# Patient Record
Sex: Male | Born: 1965 | Race: Black or African American | Hispanic: No | Smoking: Never smoker
Health system: Southern US, Community
[De-identification: ages and names within clinical notes are randomized; demographics above are authoritative.]

## PROBLEM LIST (undated history)

## (undated) DIAGNOSIS — Z9889 Other specified postprocedural states: Secondary | ICD-10-CM

## (undated) DIAGNOSIS — R7303 Prediabetes: Secondary | ICD-10-CM

## (undated) DIAGNOSIS — M069 Rheumatoid arthritis, unspecified: Secondary | ICD-10-CM

## (undated) DIAGNOSIS — E538 Deficiency of other specified B group vitamins: Secondary | ICD-10-CM

## (undated) DIAGNOSIS — Z8659 Personal history of other mental and behavioral disorders: Secondary | ICD-10-CM

## (undated) DIAGNOSIS — Z8719 Personal history of other diseases of the digestive system: Secondary | ICD-10-CM

## (undated) DIAGNOSIS — R112 Nausea with vomiting, unspecified: Secondary | ICD-10-CM

## (undated) DIAGNOSIS — I10 Essential (primary) hypertension: Secondary | ICD-10-CM

## (undated) DIAGNOSIS — M792 Neuralgia and neuritis, unspecified: Secondary | ICD-10-CM

## (undated) HISTORY — PX: BACK SURGERY: SHX140

## (undated) HISTORY — PX: LEG SURGERY: SHX1003

---

## 2003-03-03 ENCOUNTER — Ambulatory Visit (HOSPITAL_COMMUNITY): Admission: RE | Admit: 2003-03-03 | Discharge: 2003-03-03 | Payer: Self-pay | Admitting: Orthopedic Surgery

## 2006-01-11 ENCOUNTER — Encounter: Payer: Self-pay | Admitting: Physical Medicine & Rehabilitation

## 2006-02-11 ENCOUNTER — Encounter: Payer: Self-pay | Admitting: Physical Medicine & Rehabilitation

## 2006-03-13 ENCOUNTER — Encounter: Payer: Self-pay | Admitting: Physical Medicine & Rehabilitation

## 2006-04-13 ENCOUNTER — Encounter: Payer: Self-pay | Admitting: Physical Medicine & Rehabilitation

## 2006-05-14 ENCOUNTER — Encounter: Payer: Self-pay | Admitting: Physical Medicine & Rehabilitation

## 2006-06-12 ENCOUNTER — Encounter: Payer: Self-pay | Admitting: Physical Medicine & Rehabilitation

## 2006-07-13 ENCOUNTER — Encounter: Payer: Self-pay | Admitting: Physical Medicine & Rehabilitation

## 2006-08-12 ENCOUNTER — Encounter: Payer: Self-pay | Admitting: Physical Medicine & Rehabilitation

## 2006-09-12 ENCOUNTER — Encounter: Payer: Self-pay | Admitting: Physical Medicine & Rehabilitation

## 2006-10-12 ENCOUNTER — Encounter: Payer: Self-pay | Admitting: Physical Medicine & Rehabilitation

## 2006-10-28 ENCOUNTER — Ambulatory Visit: Payer: Self-pay | Admitting: Pain Medicine

## 2006-11-08 ENCOUNTER — Ambulatory Visit: Payer: Self-pay | Admitting: Pain Medicine

## 2006-11-12 ENCOUNTER — Encounter: Payer: Self-pay | Admitting: Physical Medicine & Rehabilitation

## 2006-11-22 ENCOUNTER — Ambulatory Visit: Payer: Self-pay | Admitting: Pain Medicine

## 2006-12-07 ENCOUNTER — Ambulatory Visit: Payer: Self-pay | Admitting: Pain Medicine

## 2006-12-13 ENCOUNTER — Encounter: Payer: Self-pay | Admitting: Physical Medicine & Rehabilitation

## 2006-12-22 ENCOUNTER — Ambulatory Visit: Payer: Self-pay | Admitting: Pain Medicine

## 2007-01-05 ENCOUNTER — Ambulatory Visit: Payer: Self-pay | Admitting: Pain Medicine

## 2007-01-12 ENCOUNTER — Encounter: Payer: Self-pay | Admitting: Physical Medicine & Rehabilitation

## 2007-02-10 ENCOUNTER — Ambulatory Visit: Payer: Self-pay | Admitting: Pain Medicine

## 2007-02-16 ENCOUNTER — Ambulatory Visit: Payer: Self-pay | Admitting: Pain Medicine

## 2007-03-29 ENCOUNTER — Ambulatory Visit: Payer: Self-pay | Admitting: Pain Medicine

## 2007-03-30 ENCOUNTER — Ambulatory Visit: Payer: Self-pay | Admitting: Pain Medicine

## 2007-05-10 ENCOUNTER — Ambulatory Visit: Payer: Self-pay | Admitting: Pain Medicine

## 2007-05-16 ENCOUNTER — Ambulatory Visit: Payer: Self-pay | Admitting: Pain Medicine

## 2007-06-07 ENCOUNTER — Ambulatory Visit: Payer: Self-pay | Admitting: Pain Medicine

## 2007-06-15 ENCOUNTER — Ambulatory Visit: Payer: Self-pay | Admitting: Pain Medicine

## 2007-07-05 ENCOUNTER — Ambulatory Visit: Payer: Self-pay | Admitting: Pain Medicine

## 2007-07-11 ENCOUNTER — Ambulatory Visit: Payer: Self-pay | Admitting: Pain Medicine

## 2007-08-16 ENCOUNTER — Ambulatory Visit: Payer: Self-pay | Admitting: Pain Medicine

## 2007-08-22 ENCOUNTER — Ambulatory Visit: Payer: Self-pay | Admitting: Pain Medicine

## 2007-09-21 ENCOUNTER — Ambulatory Visit: Payer: Self-pay | Admitting: Pain Medicine

## 2007-09-28 ENCOUNTER — Ambulatory Visit: Payer: Self-pay | Admitting: Pain Medicine

## 2007-11-01 ENCOUNTER — Ambulatory Visit: Payer: Self-pay | Admitting: Pain Medicine

## 2007-11-07 ENCOUNTER — Ambulatory Visit: Payer: Self-pay | Admitting: Pain Medicine

## 2007-12-13 ENCOUNTER — Ambulatory Visit: Payer: Self-pay | Admitting: Pain Medicine

## 2007-12-21 ENCOUNTER — Ambulatory Visit: Payer: Self-pay | Admitting: Pain Medicine

## 2008-01-24 ENCOUNTER — Ambulatory Visit: Payer: Self-pay | Admitting: Pain Medicine

## 2008-01-30 ENCOUNTER — Ambulatory Visit: Payer: Self-pay | Admitting: Pain Medicine

## 2008-02-07 ENCOUNTER — Ambulatory Visit: Payer: Self-pay | Admitting: Gastroenterology

## 2008-02-08 ENCOUNTER — Ambulatory Visit: Payer: Self-pay | Admitting: Gastroenterology

## 2008-02-23 ENCOUNTER — Ambulatory Visit: Payer: Self-pay | Admitting: Pain Medicine

## 2008-02-29 ENCOUNTER — Ambulatory Visit: Payer: Self-pay | Admitting: Pain Medicine

## 2008-03-20 ENCOUNTER — Ambulatory Visit: Payer: Self-pay | Admitting: Pain Medicine

## 2008-03-26 ENCOUNTER — Ambulatory Visit: Payer: Self-pay | Admitting: Pain Medicine

## 2008-04-19 ENCOUNTER — Ambulatory Visit: Payer: Self-pay | Admitting: Pain Medicine

## 2008-04-25 ENCOUNTER — Ambulatory Visit: Payer: Self-pay | Admitting: Pain Medicine

## 2008-05-17 ENCOUNTER — Ambulatory Visit: Payer: Self-pay | Admitting: Pain Medicine

## 2008-05-28 ENCOUNTER — Ambulatory Visit: Payer: Self-pay | Admitting: Pain Medicine

## 2008-06-19 ENCOUNTER — Ambulatory Visit: Payer: Self-pay | Admitting: Pain Medicine

## 2008-06-27 ENCOUNTER — Ambulatory Visit: Payer: Self-pay | Admitting: Pain Medicine

## 2008-07-19 ENCOUNTER — Ambulatory Visit: Payer: Self-pay | Admitting: Pain Medicine

## 2008-08-16 ENCOUNTER — Ambulatory Visit: Payer: Self-pay | Admitting: Pain Medicine

## 2008-08-20 ENCOUNTER — Ambulatory Visit: Payer: Self-pay | Admitting: Pain Medicine

## 2008-09-13 ENCOUNTER — Ambulatory Visit: Payer: Self-pay | Admitting: Pain Medicine

## 2008-09-19 ENCOUNTER — Ambulatory Visit: Payer: Self-pay | Admitting: Pain Medicine

## 2008-10-11 ENCOUNTER — Ambulatory Visit: Payer: Self-pay | Admitting: Pain Medicine

## 2008-11-13 ENCOUNTER — Ambulatory Visit: Payer: Self-pay | Admitting: Pain Medicine

## 2008-11-28 ENCOUNTER — Ambulatory Visit: Payer: Self-pay | Admitting: Pain Medicine

## 2008-12-27 ENCOUNTER — Ambulatory Visit: Payer: Self-pay | Admitting: Pain Medicine

## 2009-01-09 ENCOUNTER — Ambulatory Visit: Payer: Self-pay | Admitting: Pain Medicine

## 2009-01-20 IMAGING — NM NM BONE LIMITED
2 series · 10 of 10 positions shown · non-contrast
Comparison: none

REASON FOR EXAM: Lower extremity pain, greater on left
COMMENTS:

PROCEDURE:     NM  - NM LIMITED BONE SCAN 3HR [DATE]  [DATE]
RESULT:     The patient received an injection of 21.19 mCi of Technetium 99m
MDP. Three Phase Bone Scan is performed.
There is no prior study available for comparison.

[Series 1000: immediate · 4.80mm/px · 2 acquisitions, 4 frames shown]
[im 1/2]
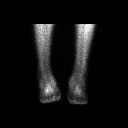
[im 1/2]
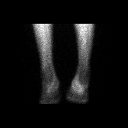
[im 2/2]
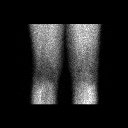
[im 2/2]
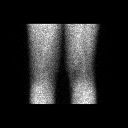

[Series 1000: flow · 4.80mm/px · 6 of 60 frames shown]
[frame 6/60  full-range]
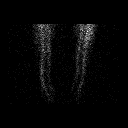
[frame 16/60  full-range]
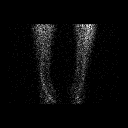
[frame 26/60  full-range]
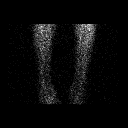
[frame 36/60]
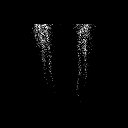
[frame 46/60  full-range]
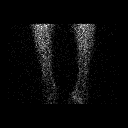
[frame 56/60  full-range]
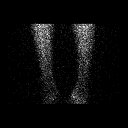

[10 of 10 positions shown; findings below may reference images not displayed]

FINDINGS: The arterial phase images demonstrate relative symmetry in both
lower extremities. There may be slightly increased flow in the RIGHT foot.
Blood pool images show some increased localization in the RIGHT foot.
Delayed images show increased localization in multiple foci in the LEFT
femur. This is somewhat subtle. Correlation with plain films is recommended.
The possibility of a pathologic lesion or impending fracture could not be
excluded. There is also increased localization in the LEFT tibia and fibula
as well as in the ankle.
IMPRESSION: Areas of abnormal uptake in the LEFT leg from the femur
into the tibia and fibula and ankle region. Whether this represents the
sequela of previous fracture or occult fracture is uncertain. Metastatic
disease cannot be completely excluded but given the patient's age and
underlying history is felt to be less likely. There are some degenerative
changes in the LEFT foot as well. There are no plain films available for
comparison.

## 2009-02-14 ENCOUNTER — Ambulatory Visit: Payer: Self-pay | Admitting: Pain Medicine

## 2009-03-18 ENCOUNTER — Ambulatory Visit: Payer: Self-pay | Admitting: Pain Medicine

## 2009-04-16 ENCOUNTER — Ambulatory Visit: Payer: Self-pay | Admitting: Pain Medicine

## 2009-05-16 ENCOUNTER — Ambulatory Visit: Payer: Self-pay | Admitting: Pain Medicine

## 2009-05-17 ENCOUNTER — Emergency Department: Payer: Self-pay | Admitting: Emergency Medicine

## 2009-06-03 ENCOUNTER — Ambulatory Visit: Payer: Self-pay | Admitting: Pain Medicine

## 2009-06-27 ENCOUNTER — Ambulatory Visit: Payer: Self-pay | Admitting: Pain Medicine

## 2009-07-24 ENCOUNTER — Ambulatory Visit: Payer: Self-pay | Admitting: Pain Medicine

## 2009-08-22 ENCOUNTER — Ambulatory Visit: Payer: Self-pay | Admitting: Pain Medicine

## 2009-08-28 ENCOUNTER — Ambulatory Visit: Payer: Self-pay | Admitting: Pain Medicine

## 2009-09-23 ENCOUNTER — Ambulatory Visit: Payer: Self-pay | Admitting: Pain Medicine

## 2009-10-21 ENCOUNTER — Ambulatory Visit: Payer: Self-pay | Admitting: Pain Medicine

## 2009-11-21 ENCOUNTER — Ambulatory Visit: Payer: Self-pay | Admitting: Pain Medicine

## 2009-11-27 ENCOUNTER — Ambulatory Visit: Payer: Self-pay | Admitting: Pain Medicine

## 2010-01-02 ENCOUNTER — Ambulatory Visit: Payer: Self-pay | Admitting: Pain Medicine

## 2010-01-22 ENCOUNTER — Ambulatory Visit: Payer: Self-pay | Admitting: Pain Medicine

## 2010-02-20 ENCOUNTER — Ambulatory Visit: Payer: Self-pay | Admitting: Pain Medicine

## 2010-02-26 ENCOUNTER — Ambulatory Visit: Payer: Self-pay | Admitting: Pain Medicine

## 2010-04-03 ENCOUNTER — Ambulatory Visit: Payer: Self-pay | Admitting: Pain Medicine

## 2010-04-30 ENCOUNTER — Ambulatory Visit: Payer: Self-pay | Admitting: Pain Medicine

## 2010-06-03 ENCOUNTER — Ambulatory Visit: Payer: Self-pay | Admitting: Pain Medicine

## 2010-07-14 ENCOUNTER — Ambulatory Visit: Payer: Self-pay | Admitting: Pain Medicine

## 2010-08-19 ENCOUNTER — Ambulatory Visit: Payer: Self-pay | Admitting: Pain Medicine

## 2010-08-27 ENCOUNTER — Ambulatory Visit: Payer: Self-pay | Admitting: Pain Medicine

## 2010-10-02 ENCOUNTER — Ambulatory Visit: Payer: Self-pay | Admitting: Pain Medicine

## 2010-10-08 ENCOUNTER — Ambulatory Visit: Payer: Self-pay | Admitting: Pain Medicine

## 2010-11-04 ENCOUNTER — Ambulatory Visit: Payer: Self-pay | Admitting: Pain Medicine

## 2010-11-12 ENCOUNTER — Ambulatory Visit: Payer: Self-pay | Admitting: Pain Medicine

## 2010-11-19 ENCOUNTER — Ambulatory Visit: Payer: Self-pay | Admitting: Pain Medicine

## 2010-11-26 ENCOUNTER — Ambulatory Visit: Payer: Self-pay | Admitting: Pain Medicine

## 2010-12-03 ENCOUNTER — Ambulatory Visit: Payer: Self-pay | Admitting: Pain Medicine

## 2010-12-30 ENCOUNTER — Ambulatory Visit: Payer: Self-pay | Admitting: Pain Medicine

## 2011-01-29 ENCOUNTER — Ambulatory Visit: Payer: Self-pay | Admitting: Pain Medicine

## 2011-02-18 ENCOUNTER — Ambulatory Visit: Payer: Self-pay | Admitting: Pain Medicine

## 2011-03-18 ENCOUNTER — Ambulatory Visit: Payer: Self-pay | Admitting: Pain Medicine

## 2011-04-15 ENCOUNTER — Ambulatory Visit: Payer: Self-pay | Admitting: Pain Medicine

## 2011-05-07 ENCOUNTER — Ambulatory Visit: Payer: Self-pay | Admitting: Pain Medicine

## 2011-05-13 ENCOUNTER — Ambulatory Visit: Payer: Self-pay | Admitting: Pain Medicine

## 2011-06-16 IMAGING — CR DG CHEST 2V
1 series · 4 of 4 positions shown · non-contrast
Comparison: none

REASON FOR EXAM: SOB
COMMENTS:

PROCEDURE:     DXR - DXR CHEST PA (OR AP) AND LATERAL  - May 17, 2009  [DATE]
RESULT:     Mild infiltrate in the right lower lobe and right upper lobe
cannot be excluded and follow-up chest x-ray is suggested. The
cardiovascular structures are unremarkable.

[Series 1: view not recorded · 0.17mm/px · 4 of 4 slices shown]
[im 1/4]
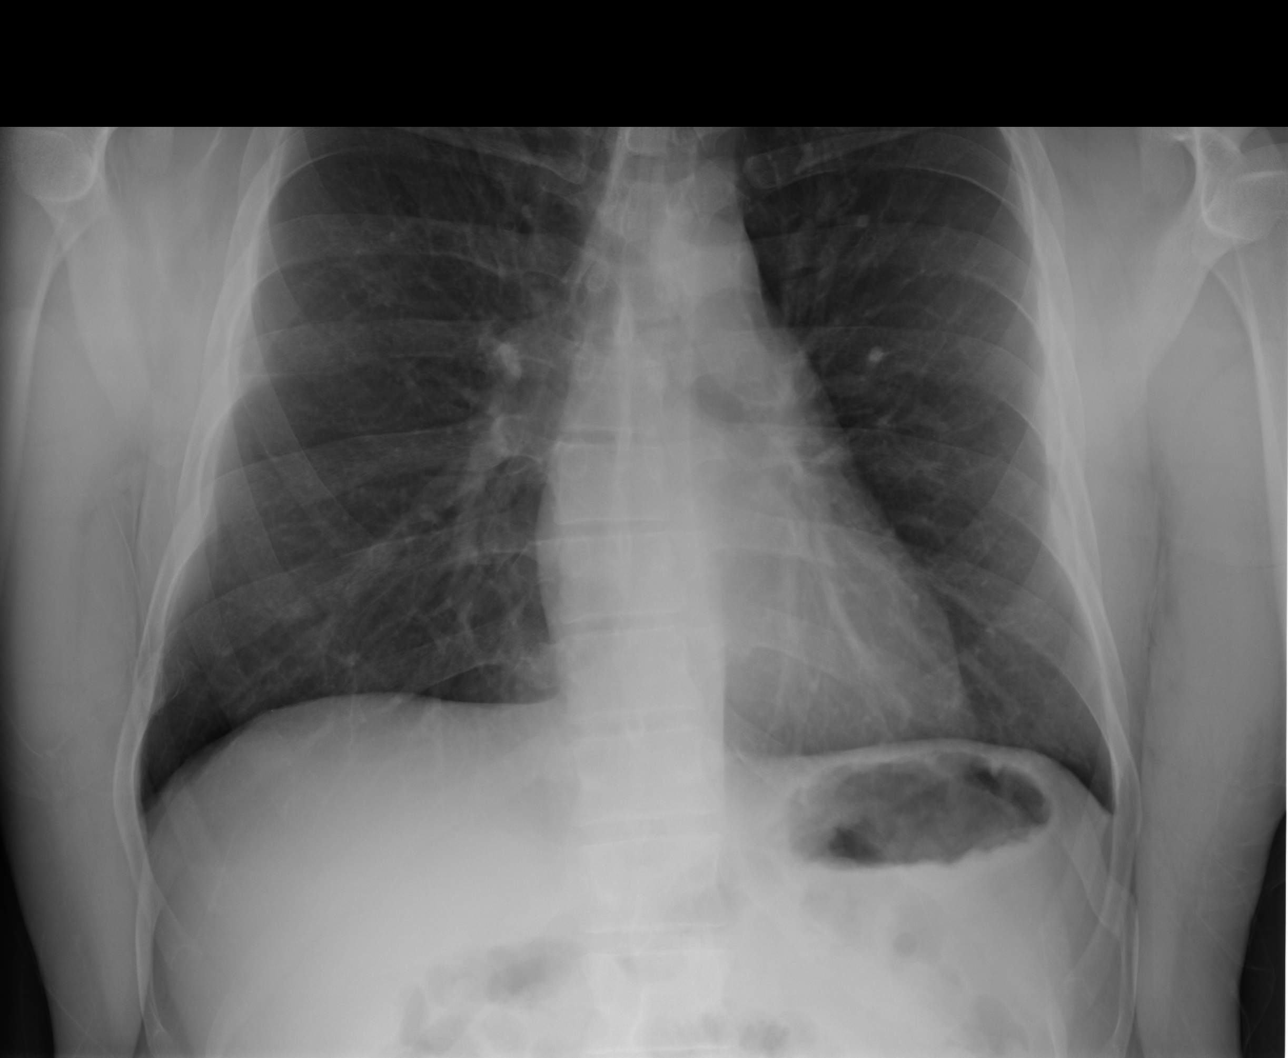
[im 2/4]
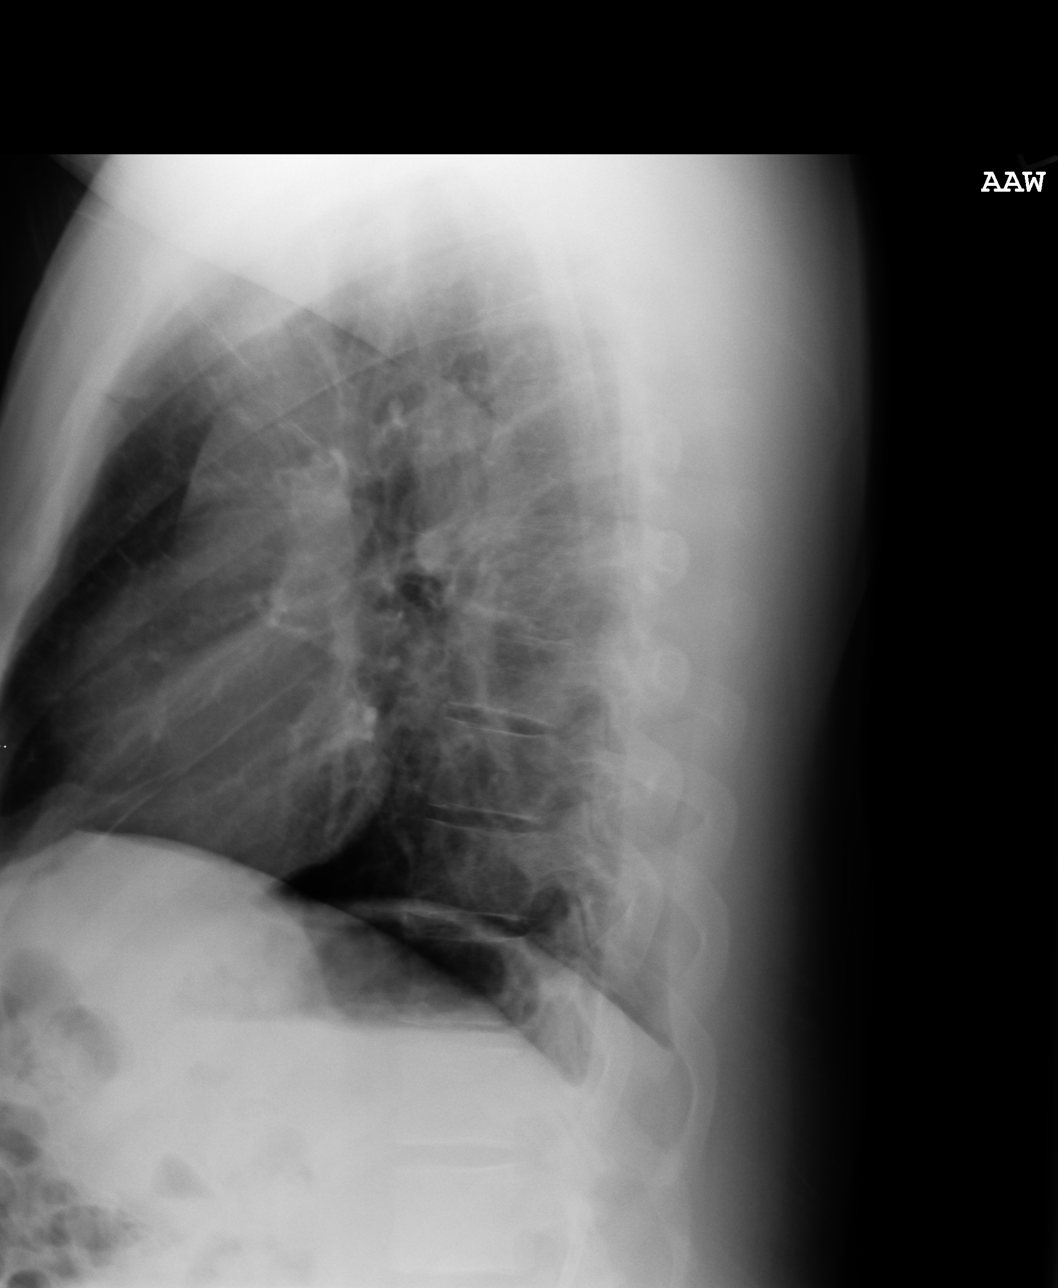
[im 3/4]
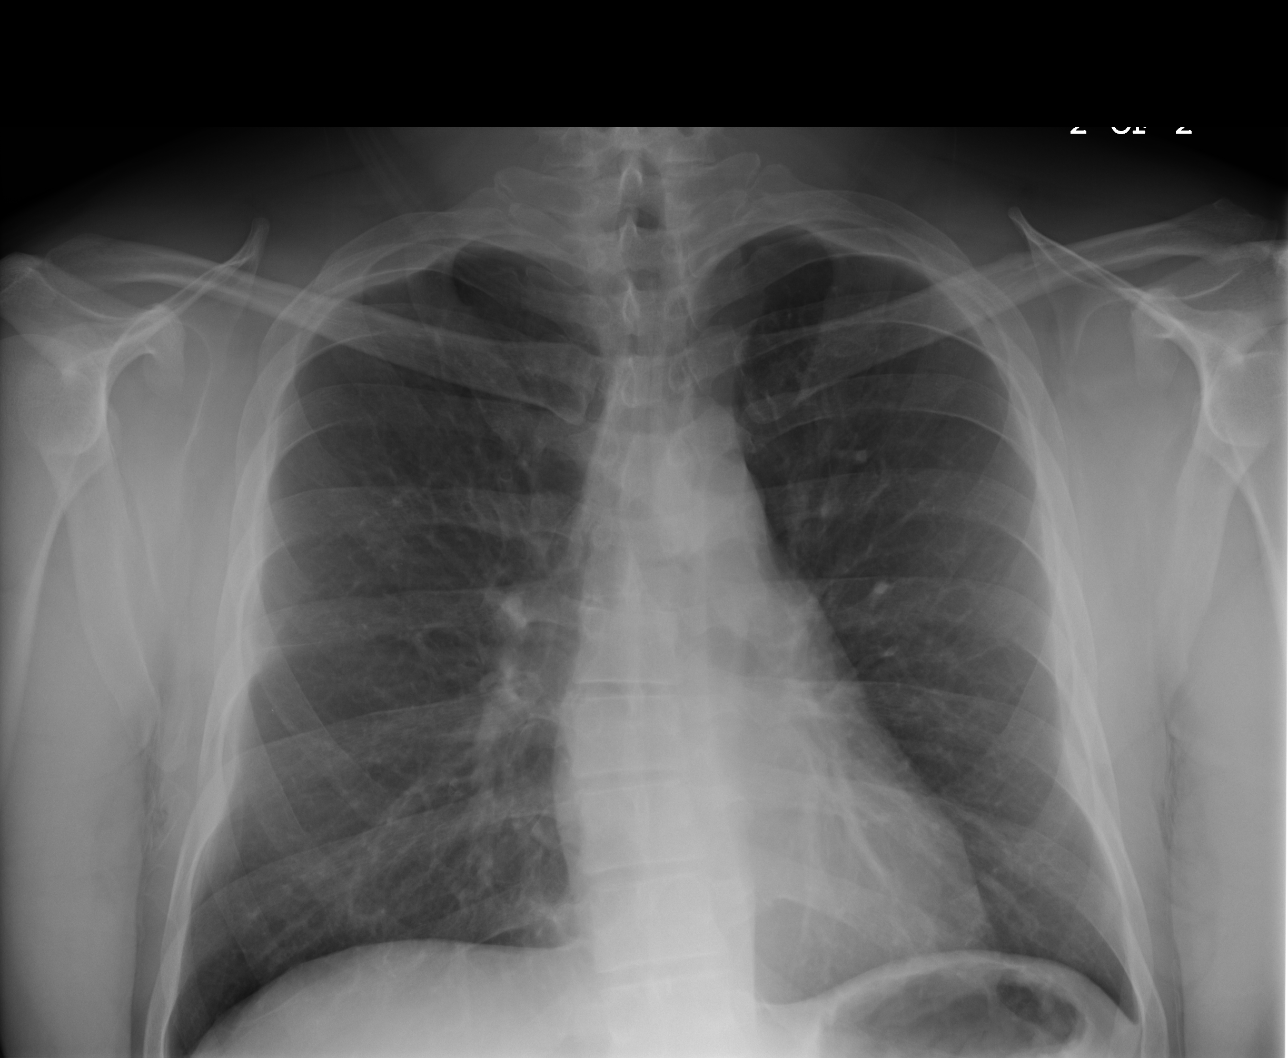
[im 4/4]
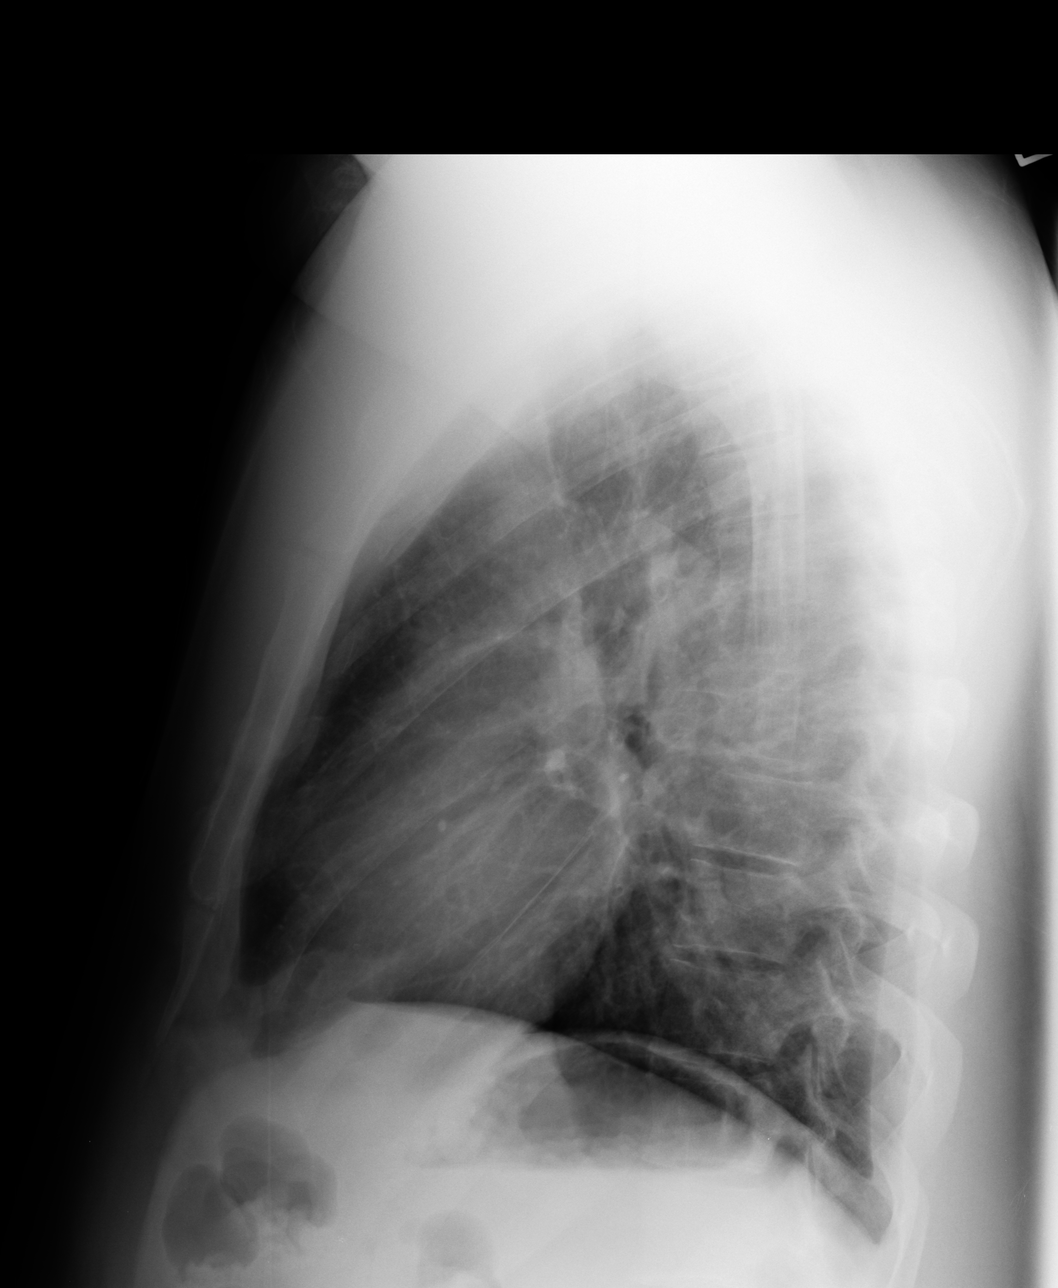

[4 of 4 positions shown; findings below may reference images not displayed]

IMPRESSION: Cannot exclude very mild infiltrate in the right lung base
and right upper lobe. Follow-up chest x-ray is suggested. Thoracic spine
scoliosis is incidentally noted.

## 2011-07-07 ENCOUNTER — Ambulatory Visit: Payer: Self-pay | Admitting: Pain Medicine

## 2011-08-06 ENCOUNTER — Ambulatory Visit: Payer: Self-pay | Admitting: Pain Medicine

## 2011-08-17 ENCOUNTER — Ambulatory Visit: Payer: Self-pay | Admitting: Pain Medicine

## 2011-09-17 ENCOUNTER — Ambulatory Visit: Payer: Self-pay | Admitting: Pain Medicine

## 2011-09-23 ENCOUNTER — Ambulatory Visit: Payer: Self-pay | Admitting: Pain Medicine

## 2011-10-29 ENCOUNTER — Ambulatory Visit: Payer: Self-pay | Admitting: Pain Medicine

## 2011-11-09 ENCOUNTER — Ambulatory Visit: Payer: Self-pay | Admitting: Pain Medicine

## 2011-12-01 ENCOUNTER — Ambulatory Visit: Payer: Self-pay | Admitting: Pain Medicine

## 2011-12-29 ENCOUNTER — Ambulatory Visit: Payer: Self-pay | Admitting: Pain Medicine

## 2012-01-13 ENCOUNTER — Ambulatory Visit: Payer: Self-pay | Admitting: Pain Medicine

## 2012-02-09 ENCOUNTER — Ambulatory Visit: Payer: Self-pay | Admitting: Pain Medicine

## 2012-02-22 ENCOUNTER — Ambulatory Visit: Payer: Self-pay | Admitting: Pain Medicine

## 2012-03-22 ENCOUNTER — Ambulatory Visit: Payer: Self-pay | Admitting: Pain Medicine

## 2012-05-16 ENCOUNTER — Ambulatory Visit: Payer: Self-pay | Admitting: Pain Medicine

## 2012-06-07 ENCOUNTER — Ambulatory Visit: Payer: Self-pay | Admitting: Pain Medicine

## 2012-06-15 ENCOUNTER — Ambulatory Visit: Payer: Self-pay | Admitting: Pain Medicine

## 2012-07-07 ENCOUNTER — Ambulatory Visit: Payer: Self-pay | Admitting: Pain Medicine

## 2012-07-25 ENCOUNTER — Ambulatory Visit: Payer: Self-pay | Admitting: Pain Medicine

## 2012-08-23 ENCOUNTER — Ambulatory Visit: Payer: Self-pay | Admitting: Pain Medicine

## 2012-08-29 ENCOUNTER — Ambulatory Visit: Payer: Self-pay | Admitting: Pain Medicine

## 2012-09-27 ENCOUNTER — Ambulatory Visit: Payer: Self-pay | Admitting: Pain Medicine

## 2012-10-10 ENCOUNTER — Ambulatory Visit: Payer: Self-pay | Admitting: Pain Medicine

## 2012-11-08 ENCOUNTER — Ambulatory Visit: Payer: Self-pay | Admitting: Pain Medicine

## 2012-12-05 ENCOUNTER — Ambulatory Visit: Payer: Self-pay | Admitting: Pain Medicine

## 2013-01-03 ENCOUNTER — Ambulatory Visit: Payer: Self-pay | Admitting: Pain Medicine

## 2013-01-09 ENCOUNTER — Ambulatory Visit: Payer: Self-pay | Admitting: Pain Medicine

## 2013-02-07 ENCOUNTER — Ambulatory Visit: Payer: Self-pay | Admitting: Pain Medicine

## 2013-03-07 ENCOUNTER — Ambulatory Visit: Payer: Self-pay | Admitting: Pain Medicine

## 2013-03-27 ENCOUNTER — Ambulatory Visit: Payer: Self-pay | Admitting: Pain Medicine

## 2013-04-11 ENCOUNTER — Ambulatory Visit: Payer: Self-pay | Admitting: Pain Medicine

## 2013-04-13 HISTORY — PX: OTHER SURGICAL HISTORY: SHX169

## 2013-05-11 ENCOUNTER — Ambulatory Visit: Payer: Self-pay | Admitting: Pain Medicine

## 2013-05-22 ENCOUNTER — Ambulatory Visit: Payer: Self-pay | Admitting: Pain Medicine

## 2013-06-19 ENCOUNTER — Emergency Department: Payer: Self-pay | Admitting: Emergency Medicine

## 2013-06-19 LAB — CBC WITH DIFFERENTIAL/PLATELET
Basophil #: 0.1 10*3/uL (ref 0.0–0.1)
Basophil %: 0.5 %
Eosinophil #: 0 10*3/uL (ref 0.0–0.7)
Eosinophil %: 0.2 %
HCT: 46.2 % (ref 40.0–52.0)
HGB: 15.5 g/dL (ref 13.0–18.0)
Lymphocyte #: 3.2 10*3/uL (ref 1.0–3.6)
Lymphocyte %: 28.3 %
MCH: 30.1 pg (ref 26.0–34.0)
MCHC: 33.5 g/dL (ref 32.0–36.0)
MCV: 90 fL (ref 80–100)
Monocyte #: 0.9 x10 3/mm (ref 0.2–1.0)
Monocyte %: 7.7 %
Neutrophil #: 7.2 10*3/uL — ABNORMAL HIGH (ref 1.4–6.5)
Neutrophil %: 63.3 %
Platelet: 214 10*3/uL (ref 150–440)
RBC: 5.15 10*6/uL (ref 4.40–5.90)
RDW: 13.2 % (ref 11.5–14.5)
WBC: 11.4 10*3/uL — ABNORMAL HIGH (ref 3.8–10.6)

## 2013-06-19 LAB — URINALYSIS, COMPLETE
Blood: NEGATIVE
Glucose,UR: NEGATIVE mg/dL (ref 0–75)
Ketone: NEGATIVE
Leukocyte Esterase: NEGATIVE
Nitrite: NEGATIVE
Ph: 5 (ref 4.5–8.0)
Protein: 100
RBC,UR: 2 /HPF (ref 0–5)
Specific Gravity: 1.036 (ref 1.003–1.030)
Squamous Epithelial: <1
WBC UR: 4 /HPF (ref 0–5)

## 2013-06-19 LAB — COMPREHENSIVE METABOLIC PANEL
Albumin: 3.6 g/dL (ref 3.4–5.0)
Alkaline Phosphatase: 93 U/L
Anion Gap: 8 (ref 7–16)
BUN: 8 mg/dL (ref 7–18)
Bilirubin,Total: 1.8 mg/dL — ABNORMAL HIGH (ref 0.2–1.0)
Calcium, Total: 9.6 mg/dL (ref 8.5–10.1)
Chloride: 97 mmol/L — ABNORMAL LOW (ref 98–107)
Co2: 30 mmol/L (ref 21–32)
Creatinine: 1.15 mg/dL (ref 0.60–1.30)
EGFR (African American): >60
EGFR (Non-African Amer.): >60
Glucose: 105 mg/dL — ABNORMAL HIGH (ref 65–99)
Osmolality: 269 (ref 275–301)
Potassium: 3.7 mmol/L (ref 3.5–5.1)
SGOT(AST): 35 U/L (ref 15–37)
SGPT (ALT): 41 U/L (ref 12–78)
Sodium: 135 mmol/L — ABNORMAL LOW (ref 136–145)
Total Protein: 8.4 g/dL — ABNORMAL HIGH (ref 6.4–8.2)

## 2013-06-19 LAB — LIPASE, BLOOD: Lipase: 125 U/L (ref 73–393)

## 2013-06-19 LAB — MONONUCLEOSIS SCREEN: Mono Test: NEGATIVE

## 2013-07-11 ENCOUNTER — Ambulatory Visit: Payer: Self-pay | Admitting: Pain Medicine

## 2013-07-26 ENCOUNTER — Ambulatory Visit: Payer: Self-pay | Admitting: Pain Medicine

## 2013-08-10 ENCOUNTER — Ambulatory Visit: Payer: Self-pay | Admitting: Pain Medicine

## 2013-09-05 ENCOUNTER — Ambulatory Visit: Payer: Self-pay | Admitting: Pain Medicine

## 2013-09-13 ENCOUNTER — Ambulatory Visit: Payer: Self-pay | Admitting: Pain Medicine

## 2013-10-03 ENCOUNTER — Ambulatory Visit: Payer: Self-pay | Admitting: Orthopedic Surgery

## 2013-10-03 DIAGNOSIS — I10 Essential (primary) hypertension: Secondary | ICD-10-CM

## 2013-10-05 ENCOUNTER — Ambulatory Visit: Payer: Self-pay | Admitting: Pain Medicine

## 2013-10-10 ENCOUNTER — Ambulatory Visit: Payer: Self-pay | Admitting: Orthopedic Surgery

## 2013-10-12 LAB — PATHOLOGY REPORT

## 2013-11-02 ENCOUNTER — Ambulatory Visit: Payer: Self-pay | Admitting: Pain Medicine

## 2013-11-13 ENCOUNTER — Ambulatory Visit: Payer: Self-pay | Admitting: Pain Medicine

## 2013-12-07 ENCOUNTER — Ambulatory Visit: Payer: Self-pay | Admitting: Pain Medicine

## 2013-12-20 ENCOUNTER — Ambulatory Visit: Payer: Self-pay | Admitting: Pain Medicine

## 2014-01-18 ENCOUNTER — Ambulatory Visit: Payer: Self-pay | Admitting: Pain Medicine

## 2014-02-05 ENCOUNTER — Ambulatory Visit: Payer: Self-pay | Admitting: Pain Medicine

## 2014-02-22 ENCOUNTER — Ambulatory Visit: Payer: Self-pay | Admitting: Pain Medicine

## 2014-03-27 ENCOUNTER — Ambulatory Visit: Payer: Self-pay | Admitting: Pain Medicine

## 2014-04-26 ENCOUNTER — Ambulatory Visit: Payer: Self-pay | Admitting: Pain Medicine

## 2014-05-08 ENCOUNTER — Ambulatory Visit: Payer: Self-pay | Admitting: Pain Medicine

## 2014-08-04 NOTE — Op Note (Signed)
PATIENT NAME:  Robert Maldonado, Robert Maldonado MR#:  [id number] DATE OF BIRTH:  1965/09/11  DATE OF PROCEDURE:  10/10/2013  PREOPERATIVE DIAGNOSIS: Right little finger, volar ganglion cyst.   POSTOPERATIVE DIAGNOSIS: Cyst, right little finger.   PROCEDURE: Excision, cyst, right little finger.   ANESTHESIA: General.   SURGEON: Kennedy BuckerMichael Menz, M.D.   DESCRIPTION OF PROCEDURE: The patient was brought to the Operating Room and, after adequate general anesthesia was obtained, the right arm was prepped and draped in the usual sterile fashion with a tourniquet applied to the upper forearm. After patient identification and timeout procedures were completed, the tourniquet was inflated to 250 mmHg. A zigzag incision was made over the middle phalanx where the mass was present, having the base on the ulnar side. A skin hook was used to elevate this flap, and the large mass was then opened, with a large amount of gray-white material present within this. This was removed and sent as part of the specimen along with a portion of the cyst wall. Additionally, there was a small skin lesion, which was also excised and sent as part of the specimen, possibly epidermal inclusion cyst. After thorough irrigation of the wound, it did not appear to be arising from the joint but from the volar surface of the phalanx, out from the soft tissues, and no deeper connection was identified. The wound was closed with simple interrupted 4-0 nylon skin sutures. A digital block was placed with 10 mL of 0.5% Sensorcaine to aid in postoperative analgesia. Sterile dressing of Xeroform, 4 x 4's and a Coban wrap were applied. Tourniquet was let down prior to dressing. Tourniquet time was 13 minutes at 250 mmHg.   SPECIMEN: The removed mass.    ____________________________ Leitha SchullerMichael J. Menz, MD mjm:cg D: 10/10/2013 20:09:38 ET T: 10/11/2013 02:57:16 ET JOB#: [id number]  cc: Leitha SchullerMichael J. Menz, MD, <Dictator> Leitha SchullerMICHAEL J MENZ MD ELECTRONICALLY SIGNED 10/11/2013  6:04

## 2015-07-19 IMAGING — CR DG CHEST 1V PORT
1 series · 1 of 1 positions shown · non-contrast
Comparison: None.

CLINICAL DATA: Cough for 1 week

EXAM:
PORTABLE CHEST - 1 VIEW

[ap]
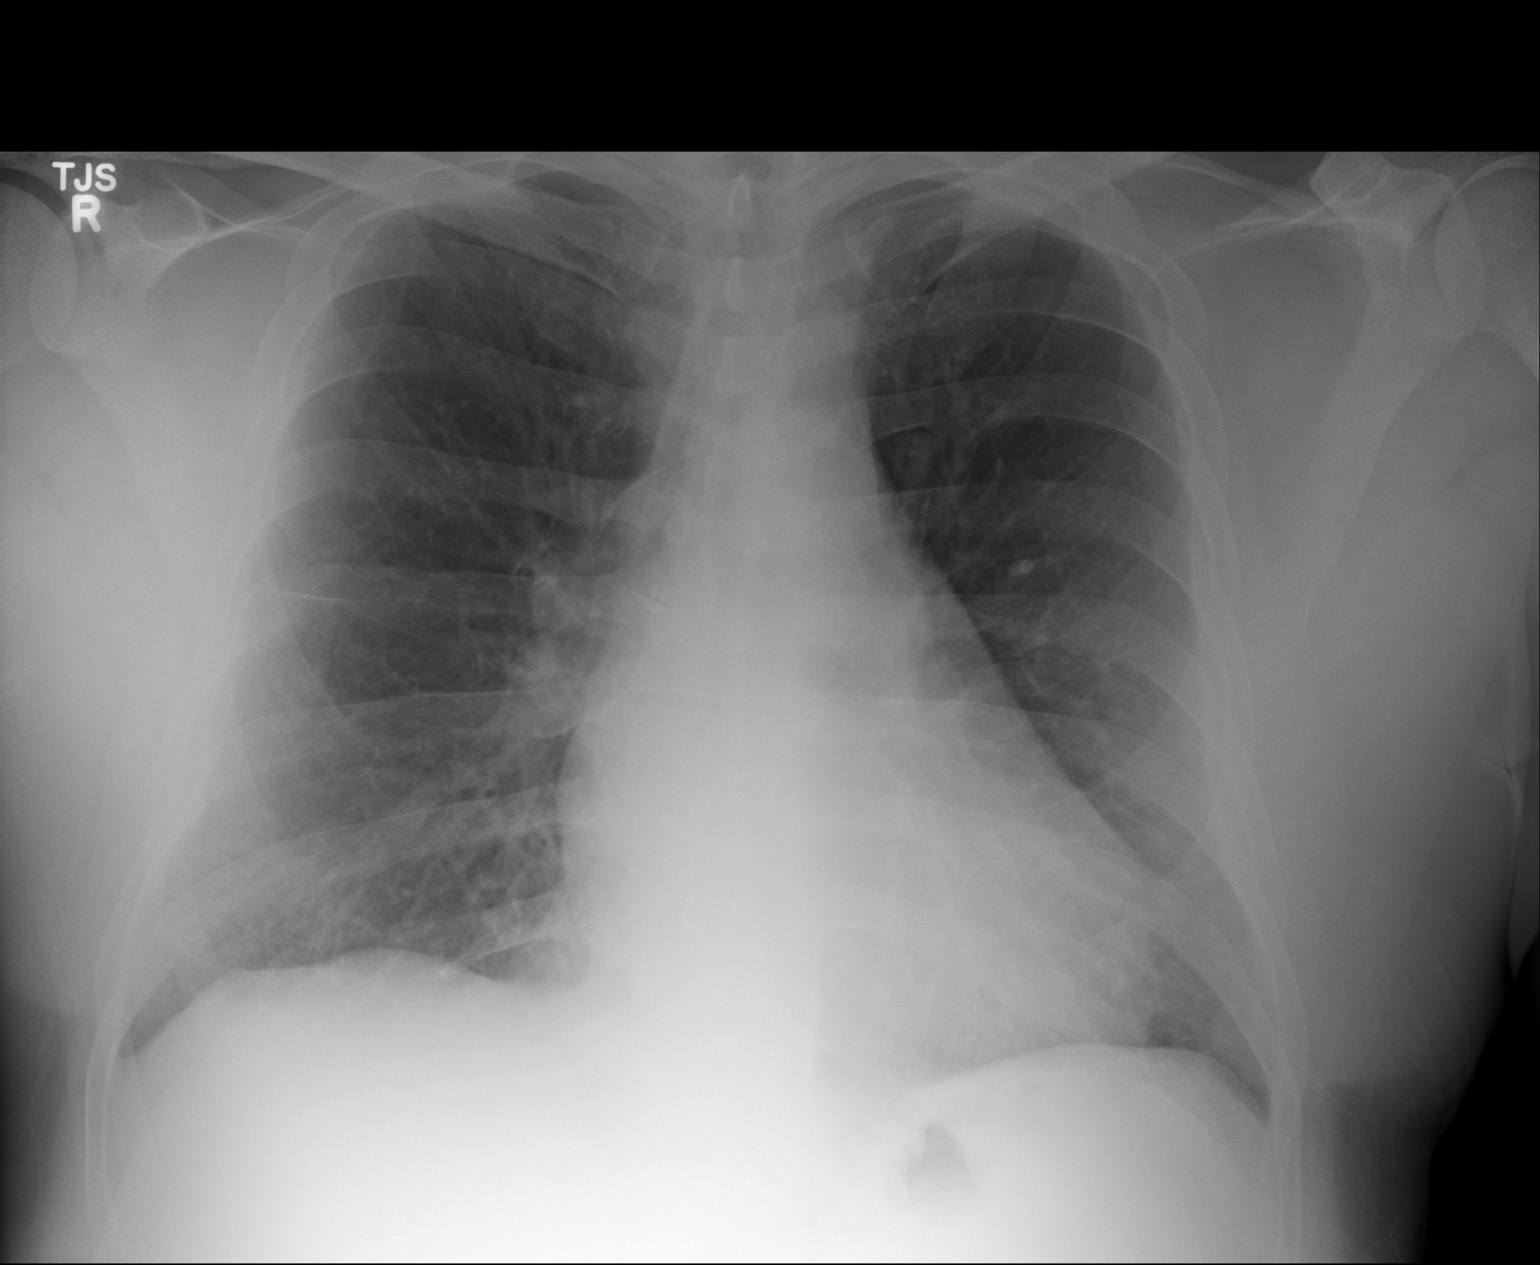

[1 of 1 positions shown; findings below may reference images not displayed]

FINDINGS: The heart size and mediastinal contours are within normal limits.
Both lungs are clear. The visualized skeletal structures are
unremarkable.
IMPRESSION: No active disease.
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
## 2016-09-25 ENCOUNTER — Emergency Department: Payer: Medicare Other

## 2016-09-25 DIAGNOSIS — I10 Essential (primary) hypertension: Secondary | ICD-10-CM | POA: Diagnosis present

## 2016-09-25 DIAGNOSIS — L03032 Cellulitis of left toe: Principal | ICD-10-CM | POA: Diagnosis present

## 2016-09-25 DIAGNOSIS — L02612 Cutaneous abscess of left foot: Secondary | ICD-10-CM | POA: Diagnosis present

## 2016-09-25 DIAGNOSIS — G894 Chronic pain syndrome: Secondary | ICD-10-CM | POA: Diagnosis present

## 2016-09-25 DIAGNOSIS — Z79899 Other long term (current) drug therapy: Secondary | ICD-10-CM

## 2016-09-25 LAB — COMPREHENSIVE METABOLIC PANEL
ALT: 19 U/L (ref 17–63)
AST: 28 U/L (ref 15–41)
Albumin: 4 g/dL (ref 3.5–5.0)
Alkaline Phosphatase: 88 U/L (ref 38–126)
Anion gap: 8 (ref 5–15)
BUN: 13 mg/dL (ref 6–20)
CO2: 28 mmol/L (ref 22–32)
Calcium: 9.2 mg/dL (ref 8.9–10.3)
Chloride: 102 mmol/L (ref 101–111)
Creatinine, Ser: 1.37 mg/dL — ABNORMAL HIGH (ref 0.61–1.24)
GFR calc Af Amer: >60 mL/min (ref >60)
GFR calc non Af Amer: 59 mL/min — ABNORMAL LOW (ref >60)
Glucose, Bld: 106 mg/dL — ABNORMAL HIGH (ref 65–99)
Potassium: 4.1 mmol/L (ref 3.5–5.1)
Sodium: 138 mmol/L (ref 135–145)
Total Bilirubin: 0.8 mg/dL (ref 0.3–1.2)
Total Protein: 7.8 g/dL (ref 6.5–8.1)

## 2016-09-25 LAB — CBC WITH DIFFERENTIAL/PLATELET
Basophils Absolute: 0.1 10*3/uL (ref 0–0.1)
Basophils Relative: 1 %
Eosinophils Absolute: 0.1 10*3/uL (ref 0–0.7)
Eosinophils Relative: 1 %
HCT: 42.6 % (ref 40.0–52.0)
Hemoglobin: 14.5 g/dL (ref 13.0–18.0)
Lymphocytes Relative: 16 %
Lymphs Abs: 2.4 10*3/uL (ref 1.0–3.6)
MCH: 30.3 pg (ref 26.0–34.0)
MCHC: 34 g/dL (ref 32.0–36.0)
MCV: 89.2 fL (ref 80.0–100.0)
Monocytes Absolute: 1 10*3/uL (ref 0.2–1.0)
Monocytes Relative: 7 %
Neutro Abs: 11.1 10*3/uL — ABNORMAL HIGH (ref 1.4–6.5)
Neutrophils Relative %: 75 %
Platelets: 232 10*3/uL (ref 150–440)
RBC: 4.78 MIL/uL (ref 4.40–5.90)
RDW: 13.3 % (ref 11.5–14.5)
WBC: 14.7 10*3/uL — ABNORMAL HIGH (ref 3.8–10.6)

## 2016-09-25 NOTE — ED Triage Notes (Signed)
Patient c/o left foot pain/swelling beginning Sunday. Pt saw PCP Wednesday. PCP circled area of infection on foot and told patient to come to ED if swelling/redness extended beyond line.  Patient has hx of surgery to same leg.

## 2016-09-26 ENCOUNTER — Inpatient Hospital Stay
Admission: EM | Admit: 2016-09-26 | Discharge: 2016-09-29 | DRG: 580 | Disposition: A | Discharge status: Home / Self Care | Payer: Medicare Other | Attending: Internal Medicine | Admitting: Internal Medicine

## 2016-09-26 DIAGNOSIS — L03119 Cellulitis of unspecified part of limb: Secondary | ICD-10-CM

## 2016-09-26 DIAGNOSIS — L02619 Cutaneous abscess of unspecified foot: Secondary | ICD-10-CM | POA: Diagnosis present

## 2016-09-26 DIAGNOSIS — L0390 Cellulitis, unspecified: Secondary | ICD-10-CM | POA: Diagnosis present

## 2016-09-26 DIAGNOSIS — M869 Osteomyelitis, unspecified: Secondary | ICD-10-CM

## 2016-09-26 DIAGNOSIS — L03032 Cellulitis of left toe: Secondary | ICD-10-CM

## 2016-09-26 HISTORY — DX: Essential (primary) hypertension: I10

## 2016-09-26 LAB — TSH: TSH: 1.808 u[IU]/mL (ref 0.350–4.500)

## 2016-09-26 MED ORDER — TRAMADOL HCL 50 MG PO TABS: 50.0000 mg | ORAL_TABLET | Freq: Three times a day (TID) | ORAL | Status: DC | PRN

## 2016-09-26 MED ORDER — METHOCARBAMOL 500 MG PO TABS
750.0000 mg | ORAL_TABLET | Freq: Three times a day (TID) | ORAL | Status: DC
  Administered 2016-09-26 – 2016-09-29 (×8): 750 mg via ORAL
  Filled 2016-09-26 (×10): qty 2

## 2016-09-26 MED ORDER — ACETAMINOPHEN 325 MG PO TABS
650.0000 mg | ORAL_TABLET | Freq: Four times a day (QID) | ORAL | Status: DC | PRN
  Administered 2016-09-29: 650 mg via ORAL
  Filled 2016-09-26: qty 2

## 2016-09-26 MED ORDER — VITAMIN B-12 1000 MCG PO TABS
2000.0000 ug | ORAL_TABLET | Freq: Every day | ORAL | Status: DC
  Administered 2016-09-26 – 2016-09-29 (×3): 2000 ug via ORAL
  Filled 2016-09-26 (×3): qty 2

## 2016-09-26 MED ORDER — ACETAMINOPHEN 650 MG RE SUPP
650.0000 mg | Freq: Four times a day (QID) | RECTAL | Status: DC | PRN
  Administered 2016-09-28: 650 mg via RECTAL
  Filled 2016-09-26: qty 1

## 2016-09-26 MED ORDER — VITAMIN D 1000 UNITS PO TABS
2000.0000 [IU] | ORAL_TABLET | Freq: Every day | ORAL | Status: DC
  Administered 2016-09-26 – 2016-09-29 (×4): 2000 [IU] via ORAL
  Filled 2016-09-26 (×4): qty 2

## 2016-09-26 MED ORDER — CELECOXIB 200 MG PO CAPS
200.0000 mg | ORAL_CAPSULE | Freq: Two times a day (BID) | ORAL | Status: DC
  Administered 2016-09-26 – 2016-09-29 (×7): 200 mg via ORAL
  Filled 2016-09-26 (×7): qty 1

## 2016-09-26 MED ORDER — GABAPENTIN 400 MG PO CAPS
1600.0000 mg | ORAL_CAPSULE | Freq: Three times a day (TID) | ORAL | Status: DC
  Administered 2016-09-26 – 2016-09-29 (×8): 1600 mg via ORAL
  Filled 2016-09-26 (×9): qty 4

## 2016-09-26 MED ORDER — ATENOLOL 50 MG PO TABS
50.0000 mg | ORAL_TABLET | Freq: Every day | ORAL | Status: DC
  Administered 2016-09-26 – 2016-09-29 (×4): 50 mg via ORAL
  Filled 2016-09-26 (×3): qty 2
  Filled 2016-09-26: qty 1

## 2016-09-26 MED ORDER — AMITRIPTYLINE HCL 50 MG PO TABS
50.0000 mg | ORAL_TABLET | Freq: Every day | ORAL | Status: DC
  Administered 2016-09-26 – 2016-09-28 (×3): 50 mg via ORAL
  Filled 2016-09-26 (×3): qty 2

## 2016-09-26 MED ORDER — DULOXETINE HCL 60 MG PO CPEP
60.0000 mg | ORAL_CAPSULE | Freq: Every day | ORAL | Status: DC
  Administered 2016-09-26 – 2016-09-28 (×2): 60 mg via ORAL
  Filled 2016-09-26 (×4): qty 1

## 2016-09-26 MED ORDER — DOCUSATE SODIUM 100 MG PO CAPS
100.0000 mg | ORAL_CAPSULE | Freq: Two times a day (BID) | ORAL | Status: DC
  Administered 2016-09-26 – 2016-09-29 (×7): 100 mg via ORAL
  Filled 2016-09-26 (×6): qty 1

## 2016-09-26 MED ORDER — ONDANSETRON HCL 4 MG/2ML IJ SOLN
4.0000 mg | Freq: Four times a day (QID) | INTRAMUSCULAR | Status: DC | PRN
  Administered 2016-09-28 (×2): 4 mg via INTRAVENOUS
  Filled 2016-09-26 (×2): qty 2

## 2016-09-26 MED ORDER — CYCLOBENZAPRINE HCL 10 MG PO TABS
10.0000 mg | ORAL_TABLET | Freq: Three times a day (TID) | ORAL | Status: DC | PRN
Start: 2016-09-26 — End: 2016-09-29

## 2016-09-26 MED ORDER — ENOXAPARIN SODIUM 40 MG/0.4ML ~~LOC~~ SOLN
40.0000 mg | SUBCUTANEOUS | Status: DC
  Administered 2016-09-26 – 2016-09-28 (×2): 40 mg via SUBCUTANEOUS
  Filled 2016-09-26 (×2): qty 0.4

## 2016-09-26 MED ORDER — METHOCARBAMOL 500 MG PO TABS: 750.0000 mg | ORAL_TABLET | Freq: Three times a day (TID) | ORAL | Status: DC | PRN

## 2016-09-26 MED ORDER — CLINDAMYCIN HCL 150 MG PO CAPS
300.0000 mg | ORAL_CAPSULE | Freq: Four times a day (QID) | ORAL | Status: DC
  Administered 2016-09-26 – 2016-09-28 (×5): 300 mg via ORAL
  Filled 2016-09-26 (×3): qty 2

## 2016-09-26 MED ORDER — VANCOMYCIN HCL IN DEXTROSE 1-5 GM/200ML-% IV SOLN
1000.0000 mg | Freq: Once | INTRAVENOUS | Status: AC
  Administered 2016-09-26: 1000 mg via INTRAVENOUS
  Filled 2016-09-26: qty 200

## 2016-09-26 MED ORDER — PANTOPRAZOLE SODIUM 40 MG PO TBEC
40.0000 mg | DELAYED_RELEASE_TABLET | Freq: Every day | ORAL | Status: DC
  Administered 2016-09-26 – 2016-09-29 (×4): 40 mg via ORAL
  Filled 2016-09-26 (×4): qty 1

## 2016-09-26 MED ORDER — ONDANSETRON HCL 4 MG PO TABS
4.0000 mg | ORAL_TABLET | Freq: Four times a day (QID) | ORAL | Status: DC | PRN
  Administered 2016-09-29: 4 mg via ORAL
  Filled 2016-09-26: qty 1

## 2016-09-26 NOTE — Progress Notes (Signed)
Patient seen and examined this morning. Continue clindamycin for cellulitis.  Management plans discussed with nursing.

## 2016-09-26 NOTE — Care Management Obs Status (Signed)
MEDICARE OBSERVATION STATUS NOTIFICATION   Patient Details  Name: Robert Maldonado MRN: [id number] Date of Birth: 05-01-1965   Medicare Observation Status Notification Given:  Yes [facility]([place] letter)    Jolee Ewingockett,Sahej Schrieber A, RN 09/26/2016, 4:21 PM

## 2016-09-26 NOTE — ED Provider Notes (Signed)
[facility] Emergency Department Provider Note   ____________________________________________   First MD Initiated Contact with Patient 09/26/16 0141     (approximate)  I have reviewed the triage vital signs and the nursing notes.   HISTORY  Chief Complaint Cellulitis (left foot)    HPI Robert Maldonado is a 51 y.o. male who comes into the hospital today with a foot infection. The patient went to his primary care physician was told this may be infected. His doctor drew a line on it and told him that if the swelling went past the line he should come into the hospital to get IV antibiotics. The patient's eyes Dr. Thursday and was given Levaquin. The patient reports that he was only given a 7 day prescription to take 1 today. He reports though that the swelling has gotten worse after 2 days of antibiotics and the redness has spread. The patient rates his pain a 7 out of 10 in intensity. The patient denies any fever. He has been taking ibuprofen 600 mg 3 times a day. The patient has not had any fevers. He is here today for evaluation and treatment of this redness.    Past Medical History:  Diagnosis Date  . Hypertension     There are no active problems to display for this patient.   Past Surgical History:  Procedure Laterality Date  . BACK SURGERY    . LEG SURGERY Left     Prior to Admission medications   Medication Sig Start Date End Date Taking? Authorizing Provider  amitriptyline (ELAVIL) 50 MG tablet Take 50 mg by mouth at bedtime.   Yes [provider]  atenolol (TENORMIN) 50 MG tablet Take 50 mg by mouth daily.   Yes [provider]  celecoxib (CELEBREX) 200 MG capsule Take 200 mg by mouth 2 (two) times daily.   Yes [provider]  cholecalciferol (VITAMIN D) 1000 units tablet Take 2,000 Units by mouth daily.   Yes [provider]  cyclobenzaprine (FLEXERIL) 10 MG tablet Take 10 mg by mouth 3 (three) times daily  as needed for muscle spasms.   Yes [provider]  DULoxetine (CYMBALTA) 60 MG capsule Take 60 mg by mouth daily.   Yes [provider]  gabapentin (NEURONTIN) 800 MG tablet Take 1,600 mg by mouth 3 (three) times daily.   Yes [provider]  levofloxacin (LEVAQUIN) 500 MG tablet Take 500 mg by mouth daily.   Yes [provider]  methocarbamol (ROBAXIN) 750 MG tablet Take 750 mg by mouth 3 (three) times daily as needed for muscle spasms.   Yes [provider]  omeprazole (PRILOSEC) 20 MG capsule Take 20 mg by mouth daily.   Yes [provider]  traMADol (ULTRAM) 50 MG tablet Take 50 mg by mouth 3 (three) times daily as needed for moderate pain.   Yes [provider]  vitamin B-12 (CYANOCOBALAMIN) 1000 MCG tablet Take 2,000 mcg by mouth daily.   Yes [provider]    Allergies Patient has no known allergies.  Family History  Problem Relation Age of Onset  . Diabetes Father     Social History Social History  Substance Use Topics  . Smoking status: Never Smoker  . Smokeless tobacco: Never Used  . Alcohol use No    Review of Systems  Constitutional: No fever/chills Eyes: No visual changes. ENT: No sore throat. Cardiovascular: Denies chest pain. Respiratory: Denies shortness of breath. Gastrointestinal: No abdominal pain.  No nausea, no vomiting.  No diarrhea.  No constipation. Genitourinary: Negative for dysuria. Musculoskeletal: Negative for back pain. Skin: Redness and swelling to left foot Neurological: Negative for headaches, focal weakness or numbness.   ____________________________________________   PHYSICAL EXAM:  VITAL SIGNS: ED Triage Vitals   Enc Vitals Group     BP 09/26/16 0300 132/87     Pulse 09/26/16 0300 59     Resp --      Temp --      Temp src --      SpO2 09/26/16 0300 100%     Weight 09/25/16 2247 250 lb (113.4 kg)     Height 09/25/16 2247 6\' 4"  (1.93 m)     Head  Circumference --      Peak Flow --      Pain Score 09/25/16 2246 7     Pain Loc --      Pain Edu? --      Excl. in GC? --     Constitutional: Alert and oriented. Well appearing and in Mild distress. Eyes: Conjunctivae are normal. PERRL. EOMI. Head: Atraumatic. Nose: No congestion/rhinnorhea. Mouth/Throat: Mucous membranes are moist.  Oropharynx non-erythematous. Cardiovascular: Normal rate, regular rhythm. Grossly normal heart sounds.  Good peripheral circulation. Respiratory: Normal respiratory effort.  No retractions. Lungs CTAB. Gastrointestinal: Soft and nontender. No distention. Positive bowel sounds Musculoskeletal: No lower extremity tenderness nor edema.   Neurologic:  Normal speech and language.  Skin:  Skin is warm, dry and intact. Erythema to left foot around the base of the first toe, soft tissue swelling also present. Psychiatric: Mood and affect are normal.   ____________________________________________   LABS (all labs ordered are listed, but only abnormal results are displayed)  Labs Reviewed  COMPREHENSIVE METABOLIC PANEL - Abnormal; Notable for the following:       Result Value   Glucose, Bld 106 (*)    Creatinine, Ser 1.37 (*)    GFR calc non Af Amer 59 (*)    All other components within normal limits  CBC WITH DIFFERENTIAL/PLATELET - Abnormal; Notable for the following:    WBC 14.7 (*)    Neutro Abs 11.1 (*)    All other components within normal limits  CULTURE, BLOOD (ROUTINE X 2)  CULTURE, BLOOD (ROUTINE X 2)   ____________________________________________  EKG  none ____________________________________________  RADIOLOGY  Dg Foot Complete Left  Result Date: 09/25/2016 CLINICAL DATA:  Left foot pain and swelling beginning Sunday. EXAM: LEFT FOOT - COMPLETE 3+ VIEW COMPARISON:  None. FINDINGS: There is soft swelling of the forefoot more so along the medial aspect of the first MTP joint. No extra-articular erosive change or soft tissue  mineralization is identified. Partially visualized intramedullary nail fixation of the distal femur with healed fracture deformity of the distal diaphysis. Healed partially imaged distal fibular fracture. No acute osseous abnormality of the left foot. No joint dislocations. No frank bone destruction is identified. There are small calcaneal enthesophytes along the dorsal and plantar aspect. IMPRESSION: Nonspecific soft tissue swelling of the foot more so along the forefoot and great toe. No frank bone destruction or fracture is identified. Healed fracture deformity of the included distal tibia and fibula with intramedullary rod noted in the distal tibia. Electronically Signed   By: Tollie Eth M.D.   On: 09/25/2016 23:18    ____________________________________________   PROCEDURES  Procedure(s) performed: None  Procedures  Critical Care performed: No  ____________________________________________   INITIAL IMPRESSION / ASSESSMENT AND PLAN / ED  COURSE  Pertinent labs & imaging results that were available during my care of the patient were reviewed by me and considered in my medical decision making (see chart for details).  This is a 51 year old who comes into the hospital today with some redness and swelling to his left foot. The patient appears to have cellulitis. He was placed on levofloxacin but I will give him a dose of vancomycin. I will admit the patient for some IV antibiotics since he has been on antibiotics for 2 days. His white blood cell count is 14.7.  Clinical Course as of Sep 26 325  Sat Sep 26, 2016  0117 Nonspecific soft tissue swelling of the foot more so along the forefoot and great toe. No frank bone destruction or fracture is identified.  Healed fracture deformity of the included distal tibia and fibula with intramedullary rod noted in the distal tibia.   DG Foot Complete Left [AW]    Clinical Course User Index [AW] Rebecka Apley, MD      ____________________________________________   FINAL CLINICAL IMPRESSION(S) / ED DIAGNOSES  Final diagnoses:  Cellulitis of toe of left foot      NEW MEDICATIONS STARTED DURING THIS VISIT:  New Prescriptions   No medications on file     Note:  This document was prepared using Dragon voice recognition software and may include unintentional dictation errors.    Rebecka Apley, MD 09/26/16 [phone]

## 2016-09-26 NOTE — ED Notes (Signed)
Pt transport to153 
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
## 2016-09-27 ENCOUNTER — Observation Stay: Payer: Medicare Other

## 2016-09-27 ENCOUNTER — Encounter
Admission: EM | Disposition: A | Discharge status: Home / Self Care | Payer: Self-pay | Source: Home / Self Care | Attending: Internal Medicine

## 2016-09-27 DIAGNOSIS — G894 Chronic pain syndrome: Secondary | ICD-10-CM | POA: Diagnosis present

## 2016-09-27 DIAGNOSIS — L03032 Cellulitis of left toe: Secondary | ICD-10-CM | POA: Diagnosis present

## 2016-09-27 DIAGNOSIS — L02612 Cutaneous abscess of left foot: Secondary | ICD-10-CM | POA: Diagnosis present

## 2016-09-27 DIAGNOSIS — L02619 Cutaneous abscess of unspecified foot: Secondary | ICD-10-CM | POA: Diagnosis present

## 2016-09-27 DIAGNOSIS — L03119 Cellulitis of unspecified part of limb: Secondary | ICD-10-CM

## 2016-09-27 DIAGNOSIS — I10 Essential (primary) hypertension: Secondary | ICD-10-CM | POA: Diagnosis present

## 2016-09-27 DIAGNOSIS — Z79899 Other long term (current) drug therapy: Secondary | ICD-10-CM | POA: Diagnosis not present

## 2016-09-27 HISTORY — DX: Cellulitis of unspecified part of limb: L02.619

## 2016-09-27 LAB — SURGICAL PCR SCREEN
MRSA, PCR: NEGATIVE
Staphylococcus aureus: NEGATIVE

## 2016-09-27 LAB — HEMOGLOBIN A1C
Hgb A1c MFr Bld: 6.1 % — ABNORMAL HIGH (ref 4.8–5.6)
Mean Plasma Glucose: 128 mg/dL

## 2016-09-27 LAB — HIV ANTIBODY (ROUTINE TESTING W REFLEX): HIV Screen 4th Generation wRfx: NONREACTIVE

## 2016-09-27 SURGERY — INCISION AND DRAINAGE
Anesthesia: General | Laterality: Left

## 2016-09-27 MED ORDER — PIPERACILLIN-TAZOBACTAM 3.375 G IVPB
3.3750 g | Freq: Three times a day (TID) | INTRAVENOUS | Status: DC
  Administered 2016-09-27 – 2016-09-29 (×4): 3.375 g via INTRAVENOUS
  Filled 2016-09-27 (×7): qty 50

## 2016-09-27 MED ORDER — VANCOMYCIN HCL 10 G IV SOLR
1500.0000 mg | Freq: Two times a day (BID) | INTRAVENOUS | Status: DC
  Administered 2016-09-28 – 2016-09-29 (×3): 1500 mg via INTRAVENOUS
  Filled 2016-09-27 (×5): qty 1500

## 2016-09-27 MED ORDER — CLINDAMYCIN HCL 300 MG PO CAPS: 300.0000 mg | ORAL_CAPSULE | Freq: Four times a day (QID) | ORAL | 0 refills | Status: DC

## 2016-09-27 MED ORDER — VANCOMYCIN HCL 10 G IV SOLR
1500.0000 mg | Freq: Once | INTRAVENOUS | Status: AC
  Administered 2016-09-27: 1500 mg via INTRAVENOUS
  Filled 2016-09-27: qty 1500

## 2016-09-27 NOTE — Consult Note (Signed)
Patient Demographics  Robert Maldonado, is a 51 y.o. male   MRN: [id number]   DOB - 1965/09/08  Admit Date - 09/26/2016    Outpatient Primary MD for the patient is Danella Penton, MD  Consult requested in the Hospital by Adrian Saran, MD, On 09/27/2016    Reason for consult: abscess left foot plantar first metatarsal head region   With History of -  Past Medical History:  Diagnosis Date  . Hypertension       Past Surgical History:  Procedure Laterality Date  . BACK SURGERY    . LEG SURGERY Left     in for   Chief Complaint  Patient presents with  . Cellulitis    left foot     HPI  Robert Maldonado  is a 51 y.o. male, Patient was admitted to Hospital on Friday through the emergency room for cellulitic left foot with abscess to the first metatarsal region. Was initially placed on IV antibiotics toes were discontinued and placed on oral antibiotics. MRI was done today which showed an abscess to the plantar first metatarsal area.    Review of Systems    In addition to the HPI above,  No Fever-chills, No Headache, No changes with Vision or hearing, No problems swallowing food or Liquids, No Chest pain, Cough or Shortness of Breath, No Abdominal pain, No Nausea or Vommitting, Bowel movements are regular, No Blood in stool or Urine, No dysuria, No new skin rashes or bruises, No new joints pains-aches,  No new weakness, tingling, numbness in any extremity, No recent weight gain or loss, No polyuria, polydypsia or polyphagia, No significant Mental Stressors.  A full 10 point Review of Systems was done, except as stated above, all other Review of Systems were negative.   Social History Social History  Substance Use Topics  . Smoking status: Never Smoker  . Smokeless tobacco: Never Used  . Alcohol use  No    Family History Family History  Problem Relation Age of Onset  . Diabetes Father      Prior to Admission medications   Medication Sig Start Date End Date Taking? Authorizing Provider  amitriptyline (ELAVIL) 50 MG tablet Take 50 mg by mouth at bedtime.   Yes [provider]  atenolol (TENORMIN) 50 MG tablet Take 50 mg by mouth daily.   Yes [provider]  celecoxib (CELEBREX) 200 MG capsule Take 200 mg by mouth 2 (two) times daily.   Yes [provider]  cholecalciferol (VITAMIN D) 1000 units tablet Take 2,000 Units by mouth daily.   Yes [provider]  cyclobenzaprine (FLEXERIL) 10 MG tablet Take 10 mg by mouth 3 (three) times daily as needed for muscle spasms.   Yes [provider]  DULoxetine (CYMBALTA) 60 MG capsule Take 60 mg by mouth daily.   Yes [provider]  gabapentin (NEURONTIN) 800 MG tablet Take 1,600 mg by mouth 3 (three) times daily.   Yes [provider]  levofloxacin (LEVAQUIN) 500 MG tablet Take 500 mg by mouth daily.   Yes [provider]  methocarbamol (ROBAXIN) 750 MG tablet Take 750 mg by mouth 3 (three) times daily as needed  for muscle spasms.   Yes [provider]  omeprazole (PRILOSEC) 20 MG capsule Take 20 mg by mouth daily.   Yes [provider]  traMADol (ULTRAM) 50 MG tablet Take 50 mg by mouth 3 (three) times daily as needed for moderate pain.   Yes [provider]  vitamin B-12 (CYANOCOBALAMIN) 1000 MCG tablet Take 2,000 mcg by mouth daily.   Yes [provider]  clindamycin (CLEOCIN) 300 MG capsule Take 1 capsule (300 mg total) by mouth every 6 (six) hours. 09/27/16 10/03/16  Adrian Saran, MD    Anti-infectives    Start     Dose/Rate Route Frequency Ordered Stop   09/27/16 0000  clindamycin (CLEOCIN) 300 MG capsule     300 mg Oral Every 6 hours 09/27/16 0921 10/03/16 2359   09/26/16 0615  clindamycin (CLEOCIN) capsule 300 mg     300 mg Oral  Every 6 hours 09/26/16 0611 10/01/16 0559   09/26/16 0215  vancomycin (VANCOCIN) IVPB 1000 mg/200 mL premix     1,000 mg 200 mL/hr over 60 Minutes Intravenous  Once 09/26/16 0210 09/26/16 0334      Scheduled Meds: . amitriptyline  50 mg Oral QHS  . atenolol  50 mg Oral Daily  . celecoxib  200 mg Oral BID  . cholecalciferol  2,000 Units Oral Daily  . clindamycin  300 mg Oral Q6H  . docusate sodium  100 mg Oral BID  . DULoxetine  60 mg Oral Daily  . enoxaparin (LOVENOX) injection  40 mg Subcutaneous Q24H  . gabapentin  1,600 mg Oral TID  . methocarbamol  750 mg Oral TID  . pantoprazole  40 mg Oral Daily  . vitamin B-12  2,000 mcg Oral Daily   Continuous Infusions: PRN Meds:.acetaminophen **OR** acetaminophen, cyclobenzaprine, ondansetron **OR** ondansetron (ZOFRAN) IV, traMADol  No Known Allergies  Physical Exam  Vitals  Blood pressure 132/78, pulse 73, temperature 98.9 F (37.2 C), temperature source Oral, resp. rate 19, height 6\' 4"  (1.93 m), weight 113.4 kg (250 lb), SpO2 98 %.  Lower Extremity exam:  Vascular:DP PT pulses are +2 over 4 bilateral  Dermatological: Patient does really have an open wound on the area he has some type of a hyperkeratotic area in the crease of his great toe and this could've been the source of bacterial infection and abscess formation to the region.  Neurological: Patient is fully sensate with light touch and vibratory sensations intact and symmetrical bilaterally. Patient is not diabetic  Ortho: Patient does have some hallux valgus changes to the foot he has swelling and puffiness to the left first metatarsophalangeal joint this is more pronounced plantarly but there is swelling throughout the dorsum of the joint days had cellulitis extending on the dorsum of his foot up to his ankle over the last couple days. The cellulitis is improved at this point.  Data Review  CBC  Recent Labs Lab 09/25/16 2249  WBC 14.7*  HGB 14.5  HCT 42.6  PLT  232  MCV 89.2  MCH 30.3  MCHC 34.0  RDW 13.3  LYMPHSABS 2.4  MONOABS 1.0  EOSABS 0.1  BASOSABS 0.1   ------------------------------------------------------------------------------------------------------------------  Chemistries   Recent Labs Lab 09/25/16 2249  NA 138  K 4.1  CL 102  CO2 28  GLUCOSE 106*  BUN 13  CREATININE 1.37*  CALCIUM 9.2  AST 28  ALT 19  ALKPHOS 88  BILITOT 0.8   ---------------------------------------------------------------------------------Imaging results:   Mr Foot Left Wo Contrast  Result  Date: 09/27/2016 CLINICAL DATA:  Left foot pain and swelling since last Sunday. EXAM: MRI OF THE LEFT FOOT WITHOUT CONTRAST TECHNIQUE: Multiplanar, multisequence MR imaging of the left foot was performed. No intravenous contrast was administered. COMPARISON:  Radiographs 09/25/2016 FINDINGS: There is an irregular fluid collection along the plantar and lateral aspect of the great toe. This is worrisome for an abscess. Mild associated cellulitis. No findings for septic arthritis or osteomyelitis. No findings to suggest septic tenosynovitis. The visualized midfoot structures are intact. No significant myofasciitis or pyomyositis. IMPRESSION: 3.7 x 3.0 cm complex fluid collection in the subcutaneous fat along the plantar and lateral aspect of the proximal phalanx of the great toe. This is worrisome for abscess. Cellulitis but no findings for myofasciitis, pyomyositis, septic arthritis or osteomyelitis. Electronically Signed   By: Rudie MeyerP.  Gallerani M.D.   On: 09/27/2016 15:19   Dg Foot Complete Left  Result Date: 09/25/2016 CLINICAL DATA:  Left foot pain and swelling beginning Sunday. EXAM: LEFT FOOT - COMPLETE 3+ VIEW COMPARISON:  None. FINDINGS: There is soft swelling of the forefoot more so along the medial aspect of the first MTP joint. No extra-articular erosive change or soft tissue mineralization is identified. Partially visualized intramedullary nail fixation of the  distal femur with healed fracture deformity of the distal diaphysis. Healed partially imaged distal fibular fracture. No acute osseous abnormality of the left foot. No joint dislocations. No frank bone destruction is identified. There are small calcaneal enthesophytes along the dorsal and plantar aspect. IMPRESSION: Nonspecific soft tissue swelling of the foot more so along the forefoot and great toe. No frank bone destruction or fracture is identified. Healed fracture deformity of the included distal tibia and fibula with intramedullary rod noted in the distal tibia. Electronically Signed   By: Tollie Ethavid  Kwon M.D.   On: 09/25/2016 23:18   Assessment & Plan: Patient has a fairly pronounced abscess to the left first metatarsal phalangeal joint region plantarly. Cellulitis appears to be better but the abscess is concentrated plantarly. There is some swelling dorsally on the joint also. Plan: Try to get the patient scheduled for incision and drainage tonight but the OR has likely 2 C-sections: On be unlikely that we'll be able to get take care of this evening. If one C-section cancels that I can probably do it but otherwise a lap wait until tomorrow. I explained this to the patient. Consent form was explained to the patient patient understands the process. I've also asked nursing to call hospitalist putting him back on his intravenous antibiotics.  Active Problems:   Cellulitis     Family Communication: Plan discussed with patient and **   Thank you for the consult, we will follow the patient with you in the Hospital.   Epimenio SarinROXLER,Crystol Walpole G M.D on 09/27/2016 at 5:49 PM  Thank you for the consult, we will follow the patient with you in the Hospital.

## 2016-09-27 NOTE — Progress Notes (Addendum)
Called [facility] hospitalist, per Dr. Orland Jarredroxler, to order IV abx for this patient. Spoke to Dr. Joneen Roachrosley who ordered Vanc and Zosyn for pharmacy to dose. Order to be put in by night nurse Lawson FiscalLori. MRSA swab will be collected tonight. Surgery to be done tomorrow. First CHG bath given. Consent signed.
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
## 2016-09-27 NOTE — Progress Notes (Signed)
Pharmacy Antibiotic Note  Robert Maldonado is a 51 y.o. male admitted on 09/26/2016 with cellulitis.  Pharmacy has been consulted for Zosyn and vancomcyin dosing.  Plan: Ke: 0.079   Vd: 79    T1/2: 8.77  Will start the patient on vancomycin 1500mg  IV every 12 hours with 6 hour stack dosing. Calculated trough at Css is 14. Trough ordered prior to 5th dose. Will monitor renal function and adjust dose as needed.   Will start patient on Zosyn 3.375 IV EI every 8 hours.   Height: 6\' 4"  (193 cm) Weight: 250 lb (113.4 kg) IBW/kg (Calculated) : 86.8  Temp (24hrs), Avg:98.5 F (36.9 C), Min:98.1 F (36.7 C), Max:98.9 F (37.2 C)   Recent Labs Lab 09/25/16 2249  WBC 14.7*  CREATININE 1.37*    Estimated Creatinine Clearance: 89.9 mL/min (A) (by C-G formula based on SCr of 1.37 mg/dL (H)).    No Known Allergies  Antimicrobials this admission: 6/17 vancomycin>>  6/17 Zosyn >> Dose adjustments this admission:  Microbiology results:   Thank you for allowing pharmacy to be a part of this patient's care.  Gardner CandleSheema M Shylo Zamor, PharmD, BCPS Clinical Pharmacist 09/27/2016 8:09 PM

## 2016-09-28 ENCOUNTER — Inpatient Hospital Stay: Payer: Medicare Other | Admitting: Anesthesiology

## 2016-09-28 ENCOUNTER — Encounter
Admission: EM | Disposition: A | Discharge status: Home / Self Care | Payer: Self-pay | Source: Home / Self Care | Attending: Internal Medicine

## 2016-09-28 ENCOUNTER — Encounter: Payer: Self-pay | Admitting: Podiatry

## 2016-09-28 HISTORY — PX: IRRIGATION AND DEBRIDEMENT FOOT: SHX6602

## 2016-09-28 LAB — CREATININE, SERUM
Creatinine, Ser: 1.3 mg/dL — ABNORMAL HIGH (ref 0.61–1.24)
GFR calc non Af Amer: >60 mL/min (ref >60)

## 2016-09-28 SURGERY — IRRIGATION AND DEBRIDEMENT FOOT
Anesthesia: General | Site: Foot | Laterality: Left | Wound class: Dirty or Infected

## 2016-09-28 MED ORDER — GLYCOPYRROLATE 0.2 MG/ML IJ SOLN
INTRAMUSCULAR | Status: AC
  Filled 2016-09-28: qty 1

## 2016-09-28 MED ORDER — PROMETHAZINE HCL 25 MG/ML IJ SOLN: 6.2500 mg | INTRAMUSCULAR | Status: DC | PRN

## 2016-09-28 MED ORDER — LACTATED RINGERS IV SOLN
INTRAVENOUS | Status: DC | PRN
  Administered 2016-09-28: 08:00:00 via INTRAVENOUS

## 2016-09-28 MED ORDER — BUPIVACAINE HCL (PF) 0.5 % IJ SOLN
INTRAMUSCULAR | Status: DC | PRN
  Administered 2016-09-28: 5 mL

## 2016-09-28 MED ORDER — LIDOCAINE HCL (PF) 2 % IJ SOLN
INTRAMUSCULAR | Status: AC
  Filled 2016-09-28: qty 2

## 2016-09-28 MED ORDER — MIDAZOLAM HCL 2 MG/2ML IJ SOLN
INTRAMUSCULAR | Status: DC | PRN
  Administered 2016-09-28: 2 mg via INTRAVENOUS

## 2016-09-28 MED ORDER — FENTANYL CITRATE (PF) 100 MCG/2ML IJ SOLN
INTRAMUSCULAR | Status: AC
  Filled 2016-09-28: qty 2

## 2016-09-28 MED ORDER — PHENYLEPHRINE HCL 10 MG/ML IJ SOLN
INTRAMUSCULAR | Status: DC | PRN
  Administered 2016-09-28 (×3): 100 ug via INTRAVENOUS

## 2016-09-28 MED ORDER — PROPOFOL 10 MG/ML IV BOLUS
INTRAVENOUS | Status: DC | PRN
  Administered 2016-09-28: 200 mg via INTRAVENOUS

## 2016-09-28 MED ORDER — DEXAMETHASONE SODIUM PHOSPHATE 10 MG/ML IJ SOLN
INTRAMUSCULAR | Status: AC
  Filled 2016-09-28: qty 1

## 2016-09-28 MED ORDER — DEXAMETHASONE SODIUM PHOSPHATE 10 MG/ML IJ SOLN
INTRAMUSCULAR | Status: DC | PRN
  Administered 2016-09-28: 10 mg via INTRAVENOUS

## 2016-09-28 MED ORDER — PROPOFOL 500 MG/50ML IV EMUL
INTRAVENOUS | Status: AC
Start: 2016-09-28 — End: 2016-09-28
  Filled 2016-09-28: qty 50

## 2016-09-28 MED ORDER — MIDAZOLAM HCL 2 MG/2ML IJ SOLN
INTRAMUSCULAR | Status: AC
  Filled 2016-09-28: qty 2

## 2016-09-28 MED ORDER — GLYCOPYRROLATE 0.2 MG/ML IJ SOLN
INTRAMUSCULAR | Status: DC | PRN
  Administered 2016-09-28: 0.2 mg via INTRAVENOUS

## 2016-09-28 MED ORDER — FENTANYL CITRATE (PF) 100 MCG/2ML IJ SOLN: 25.0000 ug | INTRAMUSCULAR | Status: DC | PRN

## 2016-09-28 MED ORDER — LIDOCAINE HCL 1 % IJ SOLN
INTRAMUSCULAR | Status: DC | PRN
  Administered 2016-09-28: 5 mL

## 2016-09-28 MED ORDER — PHENYLEPHRINE HCL 10 MG/ML IJ SOLN
INTRAMUSCULAR | Status: AC
  Filled 2016-09-28: qty 1

## 2016-09-28 MED ORDER — FENTANYL CITRATE (PF) 100 MCG/2ML IJ SOLN
INTRAMUSCULAR | Status: DC | PRN
  Administered 2016-09-28 (×4): 25 ug via INTRAVENOUS

## 2016-09-28 MED ORDER — ONDANSETRON HCL 4 MG/2ML IJ SOLN
INTRAMUSCULAR | Status: AC
  Filled 2016-09-28: qty 2

## 2016-09-28 MED ORDER — LIDOCAINE HCL (CARDIAC) 20 MG/ML IV SOLN
INTRAVENOUS | Status: DC | PRN
  Administered 2016-09-28: 100 mg via INTRAVENOUS

## 2016-09-28 MED ORDER — ONDANSETRON HCL 4 MG/2ML IJ SOLN
INTRAMUSCULAR | Status: DC | PRN
  Administered 2016-09-28: 4 mg via INTRAVENOUS

## 2016-09-28 SURGICAL SUPPLY — 28 items
BANDAGE ELASTIC 3 LF NS (GAUZE/BANDAGES/DRESSINGS) ×3 IMPLANT
BANDAGE ELASTIC 4 LF NS (GAUZE/BANDAGES/DRESSINGS) ×3 IMPLANT
BANDAGE STRETCH 3X4.1 STRL (GAUZE/BANDAGES/DRESSINGS) ×3 IMPLANT
BNDG ESMARK 4X12 TAN STRL LF (GAUZE/BANDAGES/DRESSINGS) ×3 IMPLANT
BNDG GAUZE 4.5X4.1 6PLY STRL (MISCELLANEOUS) ×3 IMPLANT
CANISTER SUCT 1200ML W/VALVE (MISCELLANEOUS) ×3 IMPLANT
CUFF TOURN 18 STER (MISCELLANEOUS) ×3 IMPLANT
DURAPREP 26ML APPLICATOR (WOUND CARE) ×3 IMPLANT
ELECT REM PT RETURN 9FT ADLT (ELECTROSURGICAL) ×3
ELECTRODE REM PT RTRN 9FT ADLT (ELECTROSURGICAL) ×1 IMPLANT
GAUZE SPONGE 4X4 12PLY STRL (GAUZE/BANDAGES/DRESSINGS) ×6 IMPLANT
GLOVE BIO SURGEON STRL SZ8 (GLOVE) ×3 IMPLANT
GLOVE INDICATOR 8.0 STRL GRN (GLOVE) ×3 IMPLANT
GOWN STRL REUS W/ TWL LRG LVL3 (GOWN DISPOSABLE) ×1 IMPLANT
GOWN STRL REUS W/TWL LRG LVL3 (GOWN DISPOSABLE) ×2
KIT RM TURNOVER STRD PROC AR (KITS) ×3 IMPLANT
NEEDLE FILTER BLUNT 18X 1/2SAF (NEEDLE) ×2
NEEDLE FILTER BLUNT 18X1 1/2 (NEEDLE) ×1 IMPLANT
NEEDLE HYPO 25X1 1.5 SAFETY (NEEDLE) ×3 IMPLANT
NS IRRIG 500ML POUR BTL (IV SOLUTION) ×3 IMPLANT
PACK EXTREMITY ARMC (MISCELLANEOUS) ×3 IMPLANT
PULSAVAC PLUS IRRIG FAN TIP (DISPOSABLE) ×3
STOCKINETTE STRL 6IN 960660 (GAUZE/BANDAGES/DRESSINGS) ×3 IMPLANT
SUT ETHILON 3-0 FS-10 30 BLK (SUTURE) ×3
SUTURE EHLN 3-0 FS-10 30 BLK (SUTURE) ×1 IMPLANT
SYR 3ML LL SCALE MARK (SYRINGE) ×3 IMPLANT
SYRINGE 10CC LL (SYRINGE) ×3 IMPLANT
TIP FAN IRRIG PULSAVAC PLUS (DISPOSABLE) ×1 IMPLANT

## 2016-09-28 NOTE — Transfer of Care (Signed)
Immediate Anesthesia Transfer of Care Note  Patient: Robert Maldonado  Procedure(s) Performed: Procedure(s): IRRIGATION AND DEBRIDEMENT FOOT (Left)  Patient Location: PACU  Anesthesia Type:General  Level of Consciousness: awake  Airway & Oxygen Therapy: Patient connected to face mask oxygen  Post-op Assessment: Post -op Vital signs reviewed and stable  Post vital signs: stable  Last Vitals:  Vitals:   09/28/16 0718 09/28/16 0836  BP: 94/71 119/84  Pulse: 65 91  Resp: 16 20  Temp: 36.9 C     Last Pain:  Vitals:   09/28/16 0718  TempSrc: Oral  PainSc:          Complications: No apparent anesthesia complications

## 2016-09-28 NOTE — Anesthesia Postprocedure Evaluation (Signed)
Anesthesia Post Note  Patient: Macio C Witters  Procedure(s) Performed: Procedure(s) (LRB): IRRIGATION AND DEBRIDEMENT FOOT (Left)  Patient location during evaluation: PACU Anesthesia Type: General Level of consciousness: awake and alert Pain management: pain level controlled Vital Signs Assessment: post-procedure vital signs reviewed and stable Respiratory status: spontaneous breathing, nonlabored ventilation, respiratory function stable and patient connected to nasal cannula oxygen Cardiovascular status: blood pressure returned to baseline and stable Postop Assessment: no signs of nausea or vomiting Anesthetic complications: no     Last Vitals:  Vitals:   09/28/16 1230 09/28/16 1335  BP: 126/68 136/75  Pulse: 64 60  Resp: 16 14  Temp: 36.9 C 36.7 C    Last Pain:  Vitals:   09/28/16 1335  TempSrc: Oral  PainSc:                  Lenard SimmerAndrew Maygen Sirico

## 2016-09-28 NOTE — Anesthesia Preprocedure Evaluation (Signed)
Anesthesia Evaluation  Patient identified by MRN, date of birth, ID band Patient awake    Reviewed: Allergy & Precautions, H&P , NPO status , Patient's Chart, lab work & pertinent test results, reviewed documented beta blocker date and time   History of Anesthesia Complications Negative for: history of anesthetic complications  Airway Mallampati: I  TM Distance: >3 FB Neck ROM: full    Dental  (+) Dental Advidsory Given, Teeth Intact   Pulmonary neg pulmonary ROS,           Cardiovascular Exercise Tolerance: Good hypertension, (-) angina(-) CAD, (-) Past MI, (-) Cardiac Stents and (-) CABG (-) dysrhythmias (-) Valvular Problems/Murmurs     Neuro/Psych negative neurological ROS  negative psych ROS   GI/Hepatic Neg liver ROS, GERD  ,  Endo/Other  negative endocrine ROS  Renal/GU negative Renal ROS  negative genitourinary   Musculoskeletal   Abdominal   Peds  Hematology negative hematology ROS (+)   Anesthesia Other Findings Past Medical History: No date: Hypertension   Reproductive/Obstetrics negative OB ROS                             Anesthesia Physical Anesthesia Plan  ASA: II  Anesthesia Plan: General   Post-op Pain Management:    Induction:   PONV Risk Score and Plan: 2 and Ondansetron and Dexamethasone  Airway Management Planned: LMA  Additional Equipment:   Intra-op Plan:   Post-operative Plan:   Informed Consent: I have reviewed the patients History and Physical, chart, labs and discussed the procedure including the risks, benefits and alternatives for the proposed anesthesia with the patient or authorized representative who has indicated his/her understanding and acceptance.   Dental Advisory Given  Plan Discussed with: Anesthesiologist, CRNA and Surgeon  Anesthesia Plan Comments:         Anesthesia Quick Evaluation

## 2016-09-28 NOTE — Op Note (Signed)
Operative note   Surgeon: Dr. Recardo EvangelistMatthew Kempton Milne, DPM.    Assistant: None    Preop diagnosis: Abscess with cellulitis plantar left foot    Postop diagnosis: Same    Procedure:   1. Incision and drainage deep abscess left foot          EBL: 5 cc    Anesthesia: Gen. with a local block    Hemostasis: Ankle tourniquet 250 mils mercury for 10 minutes    Specimen: Aerobic and anaerobic culture to the deep drainage from the abscess    Complications: None    Operative indications: Pronounced abscess plantar left first metatarsal phalangeal joint region.    Procedure:  Patient was brought into the OR and placed on the operating table in thesupine position. After anesthesia was obtained theleft lower extremity was prepped and draped in usual sterile fashion.  Operative Report: This time the attention was directed to the plantar base proximal phalanx metatarsal head region of the left foot. A fluctuance is palpable area and a 3 cm linear incision was made plantarly once this was made through the skin into the fascial layer extensive purulent drainage was encountered. The area was cultured both aerobic and anaerobic. This point the wound was opened up more and the area was explored there were some tracking laterally along the base of the proximal phalanx and some mild tracking proximally but most of the abscess was contained to that region. After compression removal as much purulence as possible there is an copiously irrigated with pulsed lavage system using thousand cc of saline. Once this was accomplished there was no more purulent drainage with compression of the area. At this point the wound was packed with a saline gauze and the incision was closed distally and proximally to the packing with 3-0 nylon simple interrupted sutures. A sterile compressive dressings in placed across the area consisting of 4 x 4's, performed Kerlix and an Ace wrap.    Patient tolerated the procedure and anesthesia  well.  Was transported from the OR to the PACU with all vital signs stable and vascular status intact. To be discharged per routine protocol.  Will follow up in approximately 1 week in the outpatient clinic.

## 2016-09-28 NOTE — Progress Notes (Signed)
Pharmacy Antibiotic Note  Robert Maldonado is a 51 y.o. male admitted on 09/26/2016 with cellulitis.  Pharmacy has been consulted for Zosyn and vancomcyin dosing.  Plan: Will continue the patient on vancomycin 1500mg  IV every 12 hours with 6 hour stack dosing. Trough ordered prior to 5th dose. Will monitor renal function and adjust dose as needed. SCr today about stable.   Zosyn 3.375 g IV EI every 8 hours.   Height: 6\' 4"  (193 cm) Weight: 253 lb 7.2 oz (115 kg) IBW/kg (Calculated) : 86.8  Temp (24hrs), Avg:98.1 F (36.7 C), Min:97.7 F (36.5 C), Max:98.4 F (36.9 C)   Recent Labs Lab 09/25/16 2249 09/28/16 1243  WBC 14.7*  --   CREATININE 1.37* 1.30*    Estimated Creatinine Clearance: 95.4 mL/min (A) (by C-G formula based on SCr of 1.3 mg/dL (H)).    No Known Allergies  Antimicrobials this admission: 6/17 vancomycin>>  6/17 Zosyn >> Dose adjustments this admission:  Microbiology results:   Thank you for allowing pharmacy to be a part of this patient's care.  Marty HeckWang, Taiylor Virden L, PharmD, BCPS Clinical Pharmacist 09/28/2016 2:39 PM

## 2016-09-28 NOTE — Progress Notes (Signed)
Called MD (Mody) received verbal order for morphine of 2mg  q 6 hrs prn for pain. Patient refused morphine stated can not tolerate. Did not add to prns. Patient vomiting this shift podx0. zofran given x 1. Will alert oncoming staff.
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
## 2016-09-28 NOTE — Anesthesia Post-op Follow-up Note (Cosign Needed)
Anesthesia QCDR form completed.        

## 2016-09-28 NOTE — H&P (Signed)
H and P has been reviewed and no changes are noted.  

## 2016-09-28 NOTE — Anesthesia Procedure Notes (Signed)
Procedure Name: LMA Insertion Date/Time: 09/28/2016 8:03 AM Performed by: Irving BurtonBACHICH, Brennyn Ortlieb Pre-anesthesia Checklist: Patient identified, Emergency Drugs available, Suction available and Patient being monitored Patient Re-evaluated:Patient Re-evaluated prior to inductionOxygen Delivery Method: Circle system utilized Preoxygenation: Pre-oxygenation with 100% oxygen Intubation Type: IV induction Ventilation: Mask ventilation without difficulty LMA: LMA inserted LMA Size: 4.5 Number of attempts: 1 Placement Confirmation: positive ETCO2 and breath sounds checked- equal and bilateral Tube secured with: Tape Dental Injury: Teeth and Oropharynx as per pre-operative assessment

## 2016-09-29 ENCOUNTER — Encounter: Payer: Self-pay | Admitting: Podiatry

## 2016-09-29 MED ORDER — CIPROFLOXACIN HCL 500 MG PO TABS: 500.0000 mg | ORAL_TABLET | Freq: Two times a day (BID) | ORAL | 0 refills | Status: AC

## 2016-09-29 MED ORDER — CLINDAMYCIN HCL 300 MG PO CAPS: 300.0000 mg | ORAL_CAPSULE | Freq: Three times a day (TID) | ORAL | 0 refills | Status: AC

## 2016-09-29 MED ORDER — TRAMADOL HCL 50 MG PO TABS: 50.0000 mg | ORAL_TABLET | Freq: Three times a day (TID) | ORAL | 0 refills | Status: DC | PRN

## 2016-09-29 MED ORDER — AMOXICILLIN-POT CLAVULANATE 875-125 MG PO TABS: 1.0000 | ORAL_TABLET | Freq: Two times a day (BID) | ORAL | 0 refills | Status: DC

## 2016-09-29 NOTE — Progress Notes (Signed)
Subjective: Patient is 1 day status post incision and drainage from abscess to the left foot. Were able to remove quite a bit of purulence out of the region a states he is doing better with her today. Pain is less today.Marland Kitchen. He is feeling better and resting fairly comfortably.  Objective: Examination shows that the swelling is down considerably cellulitis is mostly resolved. Packing was removed mostly bloody and no evidence of purulence to the packing at this timeframe.  Assessment: Stable with foot progressing nicely appears to be improved considerably since the incision and drainage.  Plan: Repack the wound today and consult dictated discharge him with an OrthoWedge shoe will get PT crutch training form for partial weightbearing on the foot. Also home health needs to change dressing on a daily basis with wet-to-dry saline packing until I see him next week in the office.
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
## 2016-09-29 NOTE — Care Management Note (Addendum)
Case Management Note  Patient Details  Name: Robert Maldonado MRN: [id number] Date of Birth: Jun 19, 1965  Subjective/Objective: Met with patient at bedside to discuss discharge planning. He lives at home with his mother. Will need crutches. Ordered from [facility]. Offered choice of home health agencies. Referral to [facility] for nursing. Patient will need daily wet to dry dressing changes. He states he is unable to drive and is home bound. PCP is Emily Filbert.                    Action/Plan: Discharging today with nursing with [facility]. Crutches ordered.   Expected Discharge Date:  09/29/16               Expected Discharge Plan:  [place]  In-House Referral:     Discharge planning Services  CM Consult  Post Acute Care Choice:  Durable Medical Equipment, Home Health Choice offered to:  Patient  DME Arranged:  Crutches DME Agency:  [place] Arranged:  RN [facility] Agency:  [place]  Status of Service:  Completed, signed off  If discussed at [place] of Stay Meetings, dates discussed:    Additional Comments:  Jolly Mango, RN 09/29/2016, 9:09 AM

## 2016-09-29 NOTE — Progress Notes (Signed)
Offered pain medication but refused tramadol, gave tylenol

## 2016-09-29 NOTE — Progress Notes (Addendum)
Discharge summary reviewed with verbal understanding. Answered all questions. 4 Rxs and belongings given at discharge. Escorted to personal vehicle via wc

## 2016-09-29 NOTE — Care Management Important Message (Signed)
Important Message  Patient Details  Name: Madilyn Hooklgin C Boudoin MRN: [id number] Date of Birth: 12-Dec-1965   Medicare Important Message Given:  Yes    Marily MemosLisa M Joyce Heitman, RN 09/29/2016, 9:03 AM

## 2016-10-01 LAB — CULTURE, BLOOD (ROUTINE X 2)
Culture: NO GROWTH
Culture: NO GROWTH

## 2016-10-03 LAB — AEROBIC/ANAEROBIC CULTURE (SURGICAL/DEEP WOUND)

## 2016-10-03 LAB — AEROBIC/ANAEROBIC CULTURE W GRAM STAIN (SURGICAL/DEEP WOUND)

## 2018-04-13 HISTORY — PX: COLONOSCOPY: SHX174

## 2018-10-25 IMAGING — CR DG FOOT COMPLETE 3+V*L*
1 series · 3 of 3 positions shown · non-contrast
Comparison: None.

CLINICAL DATA: Left foot pain and swelling beginning [REDACTED].

EXAM:
LEFT FOOT - COMPLETE 3+ VIEW

[Series 1: dg foot complete left · 0.14mm/px · 3 of 3 slices shown]
[im 1/3]
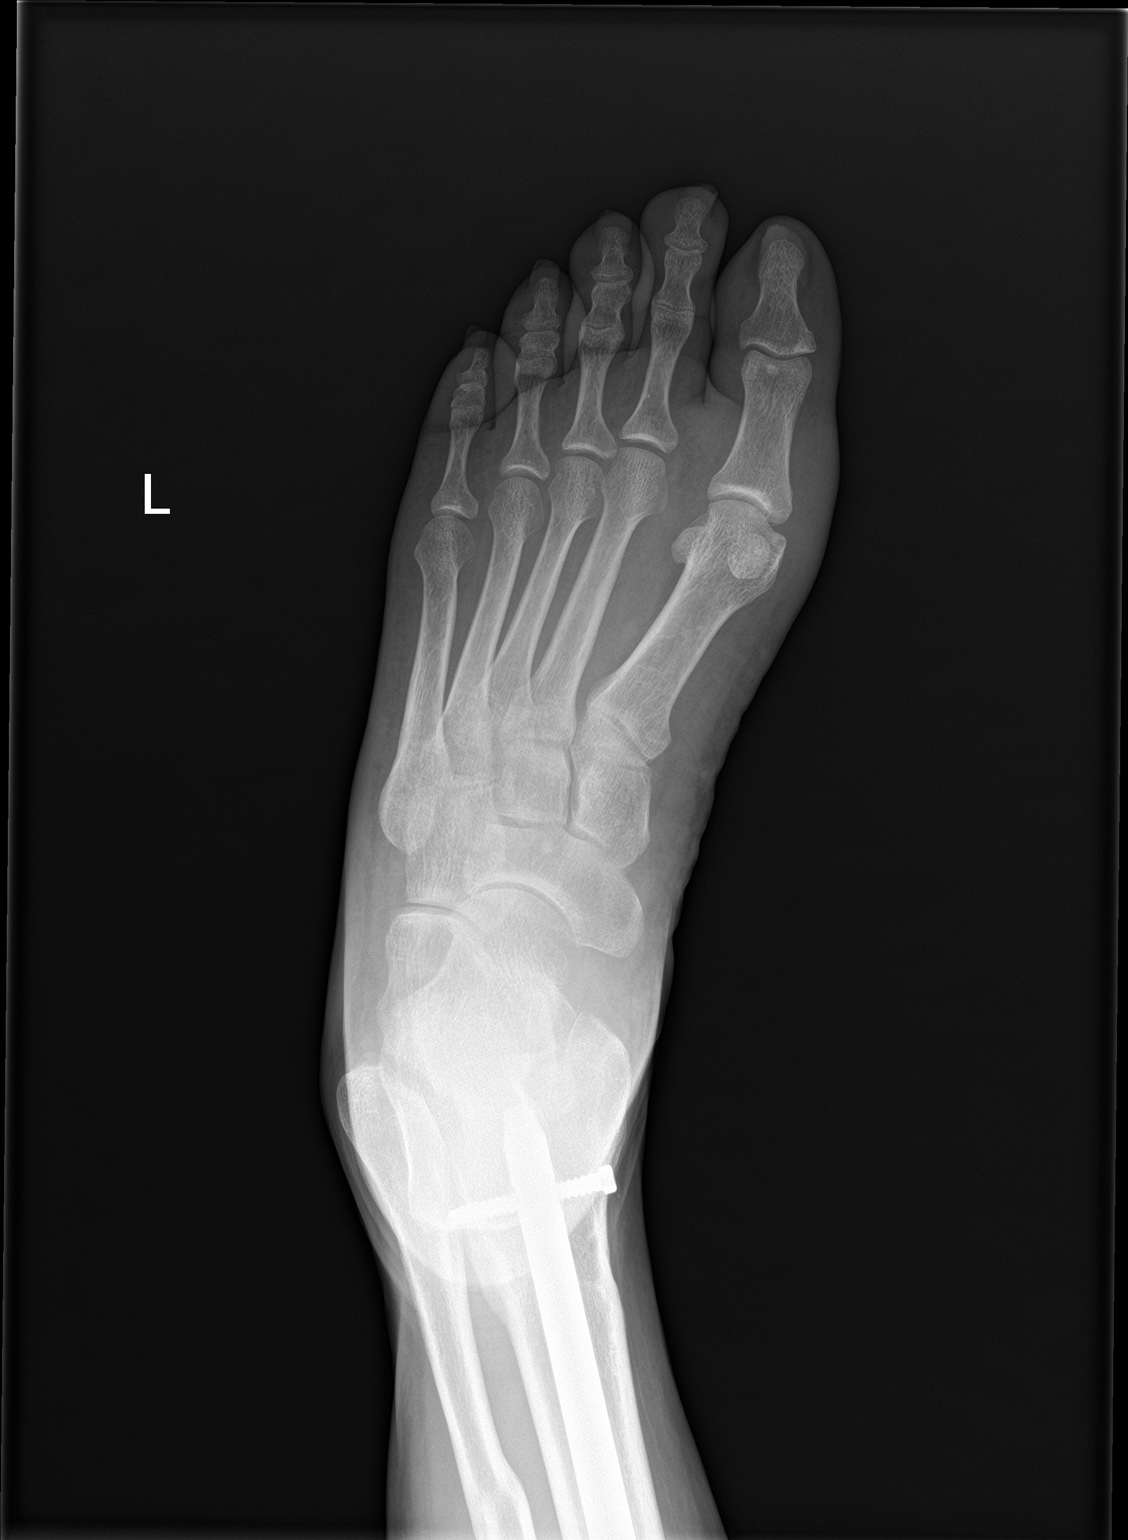
[im 2/3]
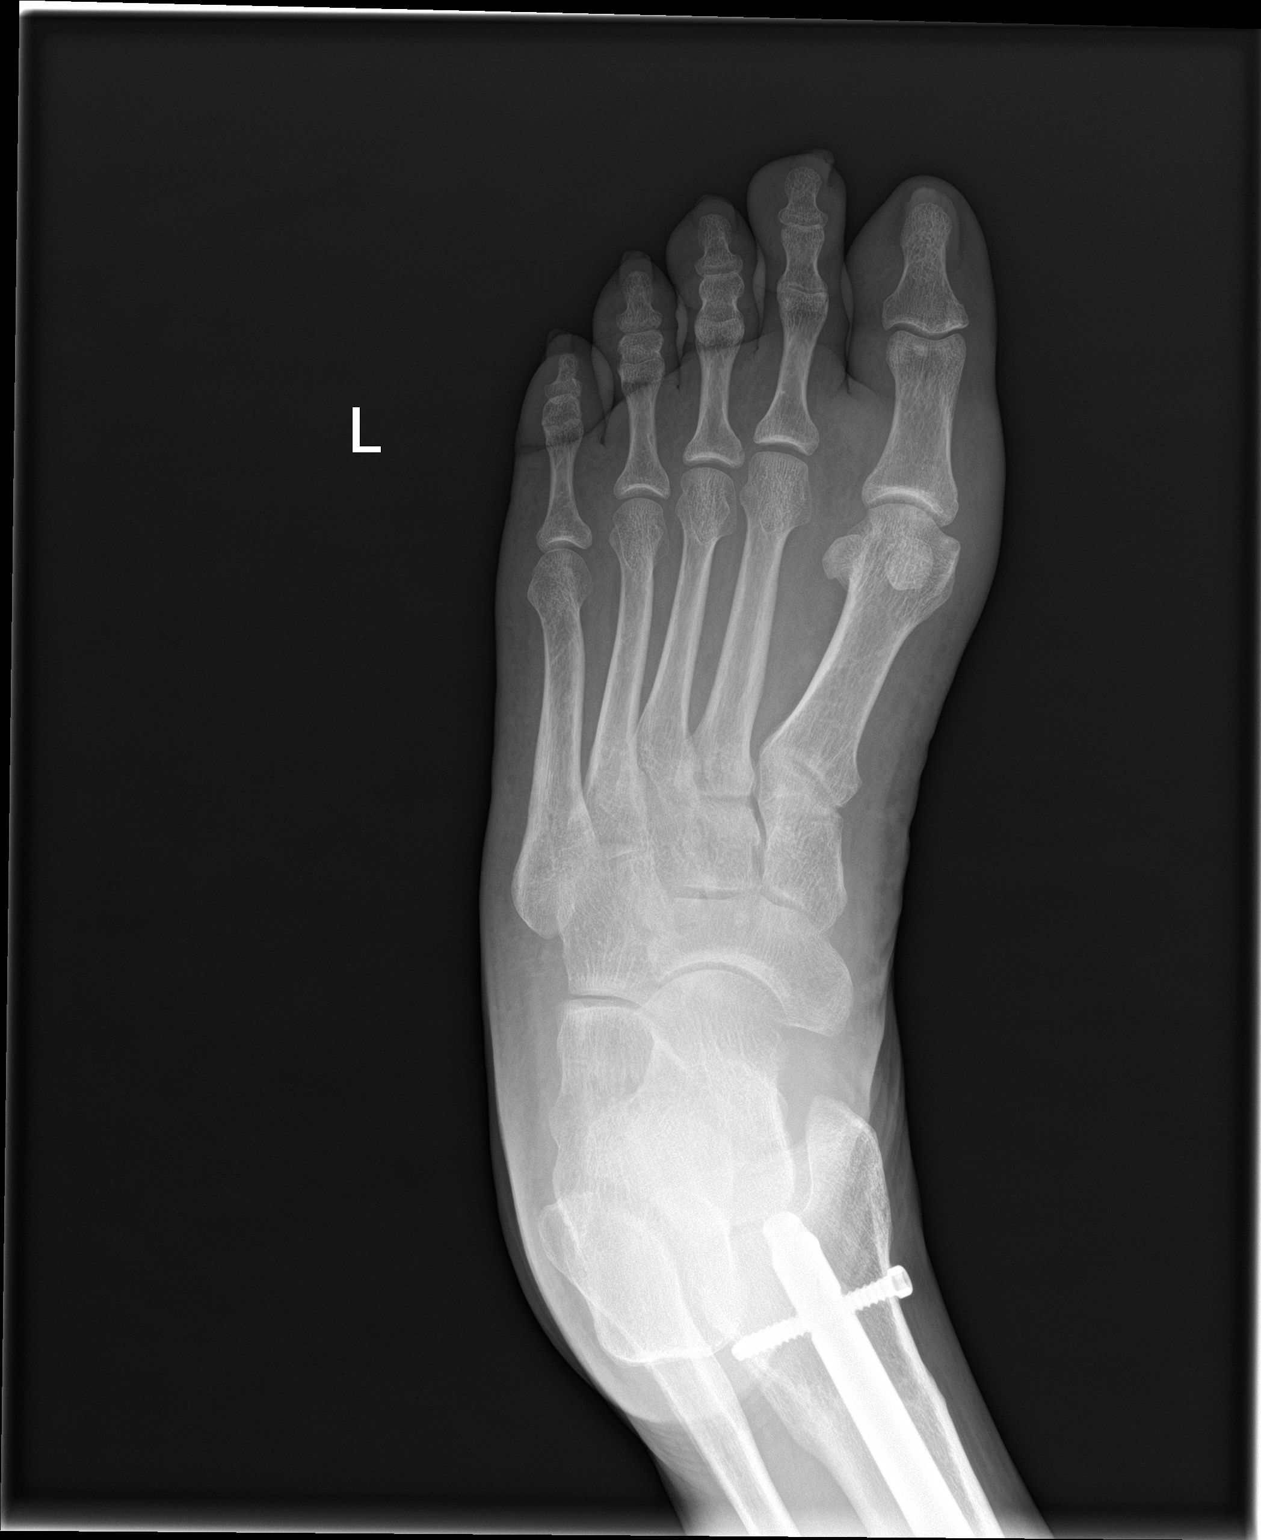
[im 3/3]
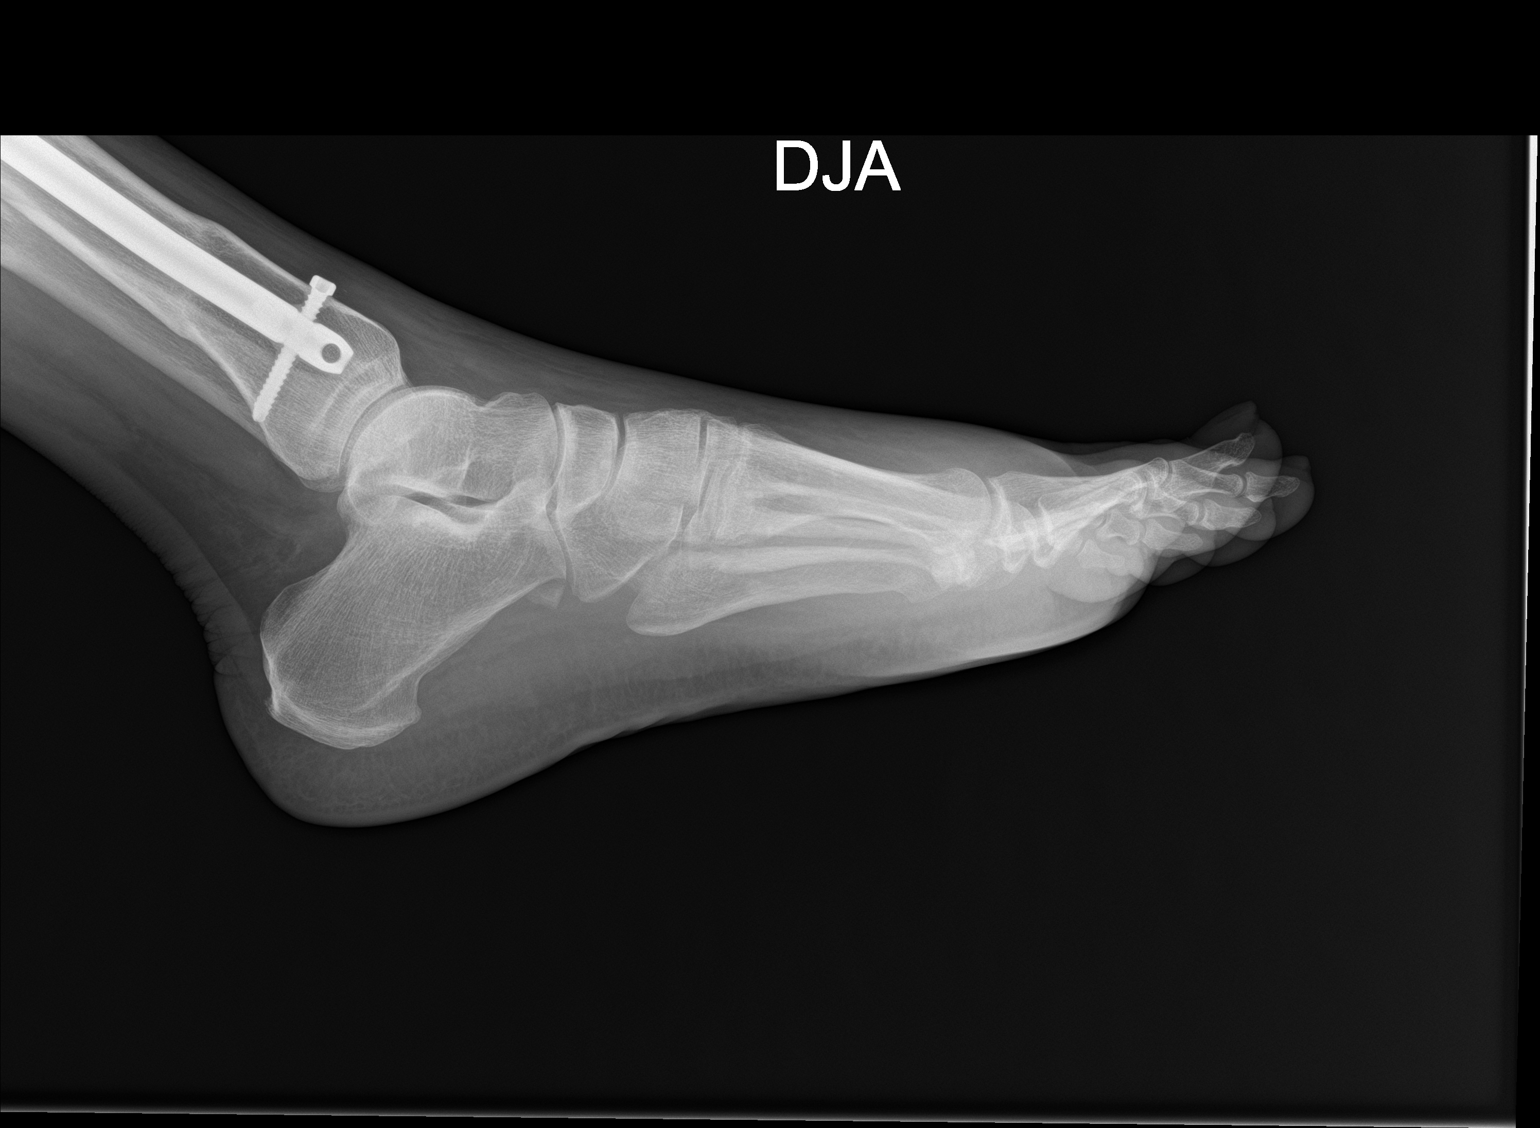

[3 of 3 positions shown; findings below may reference images not displayed]

FINDINGS: There is soft swelling of the forefoot more so along the medial
aspect of the first MTP joint. No extra-articular erosive change or
soft tissue mineralization is identified. Partially visualized
intramedullary nail fixation of the distal femur with healed
fracture deformity of the distal diaphysis. Healed partially imaged
distal fibular fracture. No acute osseous abnormality of the left
foot. No joint dislocations. No frank bone destruction is
identified. There are small calcaneal enthesophytes along the dorsal
and plantar aspect.
IMPRESSION: Nonspecific soft tissue swelling of the foot more so along the
forefoot and great toe. No frank bone destruction or fracture is
identified.

Healed fracture deformity of the included distal tibia and fibula
with intramedullary rod noted in the distal tibia.

## 2022-11-16 ENCOUNTER — Ambulatory Visit
Admission: RE | Admit: 2022-11-16 | Discharge: 2022-11-16 | Disposition: A | Discharge status: Home / Self Care | Payer: Medicare Other | Source: Ambulatory Visit | Attending: Orthopedic Surgery | Admitting: Orthopedic Surgery

## 2022-11-16 ENCOUNTER — Other Ambulatory Visit: Payer: Self-pay | Admitting: Orthopedic Surgery

## 2022-11-16 DIAGNOSIS — M4807 Spinal stenosis, lumbosacral region: Secondary | ICD-10-CM
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
## 2022-12-02 ENCOUNTER — Ambulatory Visit (INDEPENDENT_AMBULATORY_CARE_PROVIDER_SITE_OTHER): Payer: Medicare Other | Admitting: Neurosurgery

## 2022-12-02 ENCOUNTER — Encounter: Payer: Self-pay | Admitting: Neurosurgery

## 2022-12-02 VITALS — BP 146/88 | Ht 76.0 in | Wt 269.0 lb

## 2022-12-02 DIAGNOSIS — M4716 Other spondylosis with myelopathy, lumbar region: Secondary | ICD-10-CM | POA: Diagnosis not present

## 2022-12-02 DIAGNOSIS — M4714 Other spondylosis with myelopathy, thoracic region: Secondary | ICD-10-CM | POA: Diagnosis not present

## 2022-12-02 DIAGNOSIS — M47812 Spondylosis without myelopathy or radiculopathy, cervical region: Secondary | ICD-10-CM

## 2022-12-02 DIAGNOSIS — M48062 Spinal stenosis, lumbar region with neurogenic claudication: Secondary | ICD-10-CM | POA: Diagnosis not present

## 2023-01-11 ENCOUNTER — Ambulatory Visit
Admission: RE | Admit: 2023-01-11 | Discharge: 2023-01-11 | Disposition: A | Discharge status: Home / Self Care | Payer: Medicare Other | Source: Ambulatory Visit | Attending: Neurosurgery | Admitting: Neurosurgery

## 2023-01-11 DIAGNOSIS — M4714 Other spondylosis with myelopathy, thoracic region: Secondary | ICD-10-CM

## 2023-01-11 DIAGNOSIS — M4716 Other spondylosis with myelopathy, lumbar region: Secondary | ICD-10-CM | POA: Diagnosis present

## 2023-01-11 DIAGNOSIS — M47812 Spondylosis without myelopathy or radiculopathy, cervical region: Secondary | ICD-10-CM | POA: Diagnosis present

## 2023-02-17 ENCOUNTER — Ambulatory Visit: Payer: Medicare Other | Admitting: Neurosurgery

## 2023-02-17 DIAGNOSIS — M4712 Other spondylosis with myelopathy, cervical region: Secondary | ICD-10-CM | POA: Diagnosis not present

## 2023-02-17 DIAGNOSIS — M4716 Other spondylosis with myelopathy, lumbar region: Secondary | ICD-10-CM

## 2023-02-17 DIAGNOSIS — M4714 Other spondylosis with myelopathy, thoracic region: Secondary | ICD-10-CM

## 2023-02-17 HISTORY — DX: Other spondylosis with myelopathy, cervical region: M47.12

## 2023-02-17 NOTE — Progress Notes (Signed)
I do follow-up phone call today with Robert Maldonado.  He was at home and I was in the office.  He gave consent to go forward with a phone visit.  He states that he continues to have neck pain with radiating pain down into his shoulders and his hands.  He states that if he leans his head back too far he will get shooting pains into his arms and shoulders and sometimes he will feel this go straight down his back and it feels like electricity.  He does not feel like he is improving, given his concern for myelopathy we obtained MRI of his cervical spine as well as his thoracic spine.  His cervical spine as seen below demonstrates a loss of cervical lordosis and somewhat of a reversal of his lordosis.  Will plan on getting x-rays flexion-extension of his cervical spine to evaluate for any mobile or hypermobility which could be causing dynamic compression of his cord as he does have a clinical history and physical exam consistent with cervical myelopathy.  Will plan to follow-up with him shortly after his cervical x-rays to discuss a plan going forward.  We spent 11 minutes discussing his case.  Lovenia Kim, MD  Narrative & Impression  CLINICAL DATA:  Progressive myelopathy. Neck pain radiating to the left arm and leg. Difficulty walking.   EXAM: MRI THORACIC SPINE WITHOUT CONTRAST   TECHNIQUE: Multiplanar, multisequence MR imaging of the thoracic spine was performed. No intravenous contrast was administered.   COMPARISON:  None Available.   FINDINGS: Alignment:  No malalignment.   Vertebrae: No fracture or focal bone lesion. No edematous endplate marrow changes or edematous facet arthritis.   Cord: No cord compression. Ample subarachnoid space surrounds the cord throughout the thoracic region. No abnormal cord signal. No sign of cord atrophy.   Paraspinal and other soft tissues: Negative   Disc levels:   No significant disc level finding. No disc bulge or herniation. Ordinary mild  midthoracic facet osteoarthritis but without evidence of encroachment upon the spinal canal or neural foramina.   IMPRESSION: No significant finding. No cord compression or abnormal cord signal. No encroachment upon the spinal canal or neural foramina. Ordinary mild midthoracic facet osteoarthritis.     Electronically Signed   By: Paulina Fusi M.D.   On: 02/02/2023 11:08   Narrative & Impression  CLINICAL DATA:  Myelopathy, chronic, cervical spine, progressive. Neck pain radiates to the left arm and leg. Difficulty walking.   EXAM: MRI CERVICAL SPINE WITHOUT CONTRAST   TECHNIQUE: Multiplanar, multisequence MR imaging of the cervical spine was performed. No intravenous contrast was administered.   COMPARISON:  None Available.   FINDINGS: Alignment: Straightening of the normal cervical lordosis.   Vertebrae: Chronic fusion of the facet joints at the C2-3 level. No other regional bone finding of note.   Cord: No cord compression or focal cord lesion. No abnormal cord T2 signal. See below regarding stenosis.   Posterior Fossa, vertebral arteries, paraspinal tissues: Negative   Disc levels:   Foramen magnum, C1-2 and C2-3 levels are widely patent. As noted above, the facet joints at C2-3 show chronic fusion.   C3-4: Endplate osteophytes and mild bulging of the disc. Mild facet hypertrophy. Narrowing of the canal with effacement of the subarachnoid space surrounding the cord but no frank cord compression. AP diameter of the canal in the midline 8.3 mm. No abnormal cord T2 signal. Moderate bilateral foraminal narrowing could possibly affect either C4 nerve.   C4-5:  Mild bilateral uncovertebral prominence. No compressive canal stenosis. AP diameter in the midline 9.8 mm. Mild bilateral foraminal narrowing, not likely compressive.   C5-6: Endplate osteophytes and bulging of the disc, slightly more towards the left. Narrowing of the subarachnoid space surrounding the  cord. AP diameter of the canal in the midline 9.1 mm. Bilateral foraminal narrowing, left worse than right. Either C6 nerve could be affected.   C6-7: Endplate osteophytes and shallow protrusion of the disc more prominent in the right posterolateral direction. Narrowing of the ventral subarachnoid space but no compression of cord. AP diameter canal in the midline 8.8 mm. Mild bilateral foraminal narrowing, slightly worse on the right than the left. Some potential the right C7 nerve could be affected.   C7-T1: Minimal facet osteoarthritis.  No stenosis.   IMPRESSION: 1. Chronic fusion of the facet joints at C2-3. 2. C3-4: Degenerative spondylosis with narrowing of the canal but no frank cord compression. Canal AP diameter 8.3 mm. Moderate bilateral foraminal narrowing could possibly affect either C4 nerve. 3. C4-5: Mild bilateral uncovertebral prominence. No compressive stenosis. 4. C5-6: Degenerative spondylosis with narrowing of the canal but no frank cord compression. Bilateral foraminal narrowing, left worse than right. Either C6 nerve could be affected. 5. C6-7: Degenerative spondylosis with narrowing of the canal but no frank cord compression. Bilateral foraminal narrowing, right worse than left. Some potential the right C7 nerve could be affected.     Electronically Signed   By: Paulina Fusi M.D.   On: 02/02/2023 11:05

## 2023-02-25 ENCOUNTER — Ambulatory Visit
Admission: RE | Admit: 2023-02-25 | Discharge: 2023-02-25 | Disposition: A | Discharge status: Home / Self Care | Payer: Medicare Other | Source: Ambulatory Visit | Attending: Neurosurgery | Admitting: Neurosurgery

## 2023-02-25 DIAGNOSIS — M4712 Other spondylosis with myelopathy, cervical region: Secondary | ICD-10-CM

## 2023-03-15 ENCOUNTER — Other Ambulatory Visit (INDEPENDENT_AMBULATORY_CARE_PROVIDER_SITE_OTHER): Payer: Medicare Other | Admitting: Neurosurgery

## 2023-03-15 DIAGNOSIS — M4712 Other spondylosis with myelopathy, cervical region: Secondary | ICD-10-CM

## 2023-03-15 NOTE — Progress Notes (Signed)
Obtained a set of x-rays to evaluate whether or not he had any dynamic instability.  Given the very mild changes on his MRI and lack of dynamic instability we will plan on referring to neurology for evaluation given his upper motor neuron findings.

## 2023-03-18 ENCOUNTER — Ambulatory Visit (INDEPENDENT_AMBULATORY_CARE_PROVIDER_SITE_OTHER): Payer: Medicare Other | Admitting: Neurology

## 2023-03-18 ENCOUNTER — Telehealth: Payer: Self-pay | Admitting: Neurology

## 2023-03-18 ENCOUNTER — Encounter: Payer: Self-pay | Admitting: Neurology

## 2023-03-18 VITALS — BP 147/100 | HR 68 | Ht 76.0 in | Wt 266.0 lb

## 2023-03-18 DIAGNOSIS — G959 Disease of spinal cord, unspecified: Secondary | ICD-10-CM

## 2023-03-18 DIAGNOSIS — R269 Unspecified abnormalities of gait and mobility: Secondary | ICD-10-CM | POA: Insufficient documentation

## 2023-03-18 DIAGNOSIS — R531 Weakness: Secondary | ICD-10-CM | POA: Insufficient documentation

## 2023-03-18 HISTORY — DX: Disease of spinal cord, unspecified: G95.9

## 2023-03-18 HISTORY — DX: Unspecified abnormalities of gait and mobility: R26.9

## 2023-03-18 NOTE — Telephone Encounter (Signed)
medicare NPR sent to GI [phone] 
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
## 2023-05-07 ENCOUNTER — Ambulatory Visit (INDEPENDENT_AMBULATORY_CARE_PROVIDER_SITE_OTHER): Payer: Medicare PPO | Admitting: Neurology

## 2023-05-07 ENCOUNTER — Ambulatory Visit (INDEPENDENT_AMBULATORY_CARE_PROVIDER_SITE_OTHER): Payer: Self-pay | Admitting: Neurology

## 2023-05-07 VITALS — BP 142/94 | HR 90 | Ht 76.0 in

## 2023-05-07 DIAGNOSIS — R531 Weakness: Secondary | ICD-10-CM

## 2023-05-07 DIAGNOSIS — R2689 Other abnormalities of gait and mobility: Secondary | ICD-10-CM | POA: Diagnosis not present

## 2023-05-07 DIAGNOSIS — G959 Disease of spinal cord, unspecified: Secondary | ICD-10-CM

## 2023-05-07 DIAGNOSIS — R269 Unspecified abnormalities of gait and mobility: Secondary | ICD-10-CM

## 2023-05-07 DIAGNOSIS — Z0289 Encounter for other administrative examinations: Secondary | ICD-10-CM

## 2023-05-07 MED ORDER — ROLLATOR ULTRA-LIGHT MISC: 0 refills | Status: AC
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
## 2023-05-10 NOTE — Progress Notes (Signed)
EMG report is under procedure tab

## 2023-05-17 ENCOUNTER — Ambulatory Visit (INDEPENDENT_AMBULATORY_CARE_PROVIDER_SITE_OTHER): Payer: Medicare PPO | Admitting: Neurosurgery

## 2023-05-17 ENCOUNTER — Other Ambulatory Visit: Payer: Self-pay

## 2023-05-17 ENCOUNTER — Encounter: Payer: Self-pay | Admitting: Neurosurgery

## 2023-05-17 VITALS — BP 110/60 | Ht 76.0 in | Wt 268.2 lb

## 2023-05-17 DIAGNOSIS — M5010 Cervical disc disorder with radiculopathy, unspecified cervical region: Secondary | ICD-10-CM

## 2023-05-17 DIAGNOSIS — R29898 Other symptoms and signs involving the musculoskeletal system: Secondary | ICD-10-CM | POA: Insufficient documentation

## 2023-05-17 DIAGNOSIS — M4712 Other spondylosis with myelopathy, cervical region: Secondary | ICD-10-CM

## 2023-05-17 DIAGNOSIS — Z01818 Encounter for other preprocedural examination: Secondary | ICD-10-CM

## 2023-05-17 HISTORY — DX: Cervical disc disorder with radiculopathy, unspecified cervical region: M50.10
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
## 2023-05-17 NOTE — Patient Instructions (Signed)
Please see below for information in regards to your upcoming surgery:   Planned surgery: C5-7 anterior cervical discectomy and fusion   Surgery date: 06/08/23 at [facility] (Medical Mall: [address], [place], [place] [address]) - you will find out your arrival time the business day before your surgery.   Pre-op appointment at [facility] Pre-admit Testing: we will call you with a date/time for this. If you are scheduled for an in person appointment, Pre-admit Testing is located on the first floor of the [facility], [address], [address]. Please bring all prescriptions in the original prescription bottles to your appointment. During this appointment, they will advise you which medications you can take the morning of surgery, and which medications you will need to hold for surgery. Labs (such as blood work, EKG) may be done at your pre-op appointment. You are not required to fast for these labs. Should you need to change your pre-op appointment, please call Pre-admit testing at [phone].       NSAIDS (Non-steroidal anti-inflammatory drugs): because you are having a fusion, please avoid taking any NSAIDS (examples: ibuprofen, motrin, aleve, naproxen, meloxicam, diclofenac) for 3 months after surgery. Celebrex is an exception and is OK to take, if prescribed. Tylenol is not an NSAID.    Common restrictions after surgery: No bending, lifting, or twisting ("BLT"). Avoid lifting objects heavier than 10 pounds for the first 6 weeks after surgery. Where possible, avoid household activities that involve lifting, bending, reaching, pushing, or pulling such as laundry, vacuuming, grocery shopping, and childcare. Try to arrange for help from friends and family for these activities while you heal. Do not drive while taking prescription pain medication. Weeks 6 through 12 after surgery: avoid lifting more than 25 pounds.    X-rays after surgery:  Because you are having a fusion or arthroplasty: for appointments after your 2 week follow-up: please arrive at the [facility] outpatient imaging center ([address], [place]) or [facility] one hour prior to your appointment for x-rays. This applies to every appointment after your 2 week follow-up. Failure to do so may result in your appointment being rescheduled.   How to contact us:  If you have any questions/concerns before or after surgery, you can reach [place] at [phone], or you can send a mychart message. We can be reached by phone or mychart 8am-4pm, Monday-Friday.  *Please note: Calls after 4pm are forwarded to a third party answering service. Mychart messages are not routinely monitored during evenings, weekends, and holidays. Please call our office to contact the answering service for urgent concerns during non-business hours.     If you have FMLA/disability paperwork, please drop it off or fax it to [phone], attention Patty.   Appointments/FMLA & disability paperwork: Joycelyn Rua, & Flonnie Hailstone Registered Nurse/Surgery scheduler: Royston Cowper Medical Assistants: Nash Mantis Physician Assistants: Joan Flores, PA-C, Manning Charity, PA-C & Drake Leach, PA-C Surgeons: Venetia Night, MD & Ernestine Mcmurray, MD
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
## 2023-05-19 DIAGNOSIS — R29898 Other symptoms and signs involving the musculoskeletal system: Secondary | ICD-10-CM

## 2023-05-19 HISTORY — DX: Other symptoms and signs involving the musculoskeletal system: R29.898

## 2023-05-26 ENCOUNTER — Other Ambulatory Visit: Payer: Self-pay

## 2023-05-26 ENCOUNTER — Encounter
Admission: RE | Admit: 2023-05-26 | Discharge: 2023-05-26 | Disposition: A | Discharge status: Home / Self Care | Payer: Medicare PPO | Source: Ambulatory Visit | Attending: Neurosurgery | Admitting: Neurosurgery

## 2023-05-26 VITALS — BP 151/92 | HR 56 | Temp 97.4°F | Resp 18 | Ht 76.0 in | Wt 266.2 lb

## 2023-05-26 DIAGNOSIS — E119 Type 2 diabetes mellitus without complications: Secondary | ICD-10-CM | POA: Insufficient documentation

## 2023-05-26 DIAGNOSIS — Z01812 Encounter for preprocedural laboratory examination: Secondary | ICD-10-CM

## 2023-05-26 DIAGNOSIS — Z01818 Encounter for other preprocedural examination: Secondary | ICD-10-CM | POA: Diagnosis not present

## 2023-05-26 DIAGNOSIS — Z01810 Encounter for preprocedural cardiovascular examination: Secondary | ICD-10-CM

## 2023-05-26 HISTORY — DX: Prediabetes: R73.03

## 2023-05-26 HISTORY — DX: Other specified postprocedural states: Z98.890

## 2023-05-26 HISTORY — DX: Nausea with vomiting, unspecified: R11.2

## 2023-05-26 LAB — BASIC METABOLIC PANEL
Anion gap: 10 (ref 5–15)
BUN: 13 mg/dL (ref 6–20)
CO2: 24 mmol/L (ref 22–32)
Calcium: 9 mg/dL (ref 8.9–10.3)
Chloride: 103 mmol/L (ref 98–111)
Creatinine, Ser: 1.38 mg/dL — ABNORMAL HIGH (ref 0.61–1.24)
GFR, Estimated: >60 mL/min (ref >60)
Glucose, Bld: 92 mg/dL (ref 70–99)
Potassium: 4.4 mmol/L (ref 3.5–5.1)
Sodium: 137 mmol/L (ref 135–145)

## 2023-05-26 LAB — CBC
HCT: 47.2 % (ref 39.0–52.0)
Hemoglobin: 15.9 g/dL (ref 13.0–17.0)
MCH: 30.1 pg (ref 26.0–34.0)
MCHC: 33.7 g/dL (ref 30.0–36.0)
MCV: 89.2 fL (ref 80.0–100.0)
Platelets: 250 10*3/uL (ref 150–400)
RBC: 5.29 MIL/uL (ref 4.22–5.81)
RDW: 13 % (ref 11.5–15.5)
WBC: 5.6 10*3/uL (ref 4.0–10.5)
nRBC: 0 % (ref 0.0–0.2)

## 2023-05-26 LAB — TYPE AND SCREEN
ABO/RH(D): O POS
Antibody Screen: NEGATIVE

## 2023-05-26 LAB — SURGICAL PCR SCREEN
MRSA, PCR: NEGATIVE
Staphylococcus aureus: NEGATIVE

## 2023-05-26 NOTE — Patient Instructions (Addendum)
 Your procedure is scheduled on: Tuesday February 25  Report to the Registration Desk on the 1st floor of the [facility] To find out your arrival time, please call [phone] between 1PM - 3PM on:  Monday February 24  If your arrival time is 6:00 am, do not arrive before that time as the [facility] entrance doors do not open until 6:00 am.  REMEMBER: Instructions that are not followed completely may result in serious medical risk, up to and including death; or upon the discretion of your surgeon and anesthesiologist your surgery may need to be rescheduled.  Do not eat food after midnight the night before surgery.  No gum chewing or hard candies.  You may however, drink CLEAR liquids up to 2 hours before you are scheduled to arrive for your surgery. Do not drink anything within 2 hours of your scheduled arrival time.  Clear liquids include: - water  - apple juice without pulp - gatorade (not RED colors) - black coffee or tea (Do NOT add milk or creamers to the coffee or tea) Do NOT drink anything that is not on this list.   One week prior to surgery:  Tuesday February 18  Stop Anti-inflammatories (NSAIDS) such as Advil, Aleve, Ibuprofen, Motrin, Naproxen, Naprosyn and Aspirin based products such as Excedrin, Goody's Powder, BC Powder. Stop ANY OVER THE COUNTER supplements until after surgery. cholecalciferol (VITAMIN D)  diclofenac (VOLTAREN)  vitamin B-12 (CYANOCOBALAMIN)   You may however, continue to take Tylenol if needed for pain up until the day of surgery.  Continue taking all of your other prescription medications up until the day of surgery.  ON THE DAY OF SURGERY ONLY TAKE THESE MEDICATIONS WITH SIPS OF WATER:  atenolol (TENORMIN)  omeprazole (PRILOSEC)  PARoxetine (PAXIL)  gabapentin (NEURONTIN)   No Alcohol for 24 hours before or after surgery.  No Smoking including e-cigarettes for 24 hours before surgery.  No chewable tobacco products for at least 6  hours before surgery.  No nicotine patches on the day of surgery.  Do not use any "recreational" drugs for at least a week (preferably 2 weeks) before your surgery.  Please be advised that the combination of cocaine and anesthesia may have negative outcomes, up to and including death. If you test positive for cocaine, your surgery will be cancelled.  On the morning of surgery brush your teeth with toothpaste and water, you may rinse your mouth with mouthwash if you wish. Do not swallow any toothpaste or mouthwash.  Use CHG Soap as directed on instruction sheet.  Do not wear jewelry, make-up, hairpins, clips or nail polish.  For welded (permanent) jewelry: bracelets, anklets, waist bands, etc.  Please have this removed prior to surgery.  If it is not removed, there is a chance that hospital personnel will need to cut it off on the day of surgery.  Do not wear lotions, powders, or perfumes.   Do not shave body hair from the neck down 48 hours before surgery.  Do not bring valuables to the hospital. [facility] is not responsible for any missing/lost belongings or valuables.   Notify your doctor if there is any change in your medical condition (cold, fever, infection).  Wear comfortable clothing (specific to your surgery type) to the hospital.  After surgery, you can help prevent lung complications by doing breathing exercises.  Take deep breaths and cough every 1-2 hours.   If you are being admitted to the hospital overnight, leave your suitcase in  the car. After surgery it may be brought to your room.  In case of increased patient census, it may be necessary for you, the patient, to continue your postoperative care in the Same Day Surgery department.  If you are being discharged the day of surgery, you will not be allowed to drive home. You will need a responsible individual to drive you home and stay with you for 24 hours after surgery.   If you are taking public transportation,  you will need to have a responsible individual with you.  Please call the Pre-admissions Testing Dept. at [phone] if you have any questions about these instructions.  Surgery Visitation Policy:  Patients having surgery or a procedure may have two visitors.  Children under the age of 52 must have an adult with them who is not the patient.  Temporary Visitor Restrictions Due to increasing cases of flu, RSV and COVID-19: Children ages 25 and under will not be able to visit patients in [facility] hospitals under most circumstances.  Inpatient Visitation:    Visiting hours are 7 a.m. to 8 p.m. Up to four visitors are allowed at one time in a patient room. The visitors may rotate out with other people during the day.  One visitor age 29 or older may stay with the patient overnight and must be in the room by 8 p.m.         Pre-operative 5 CHG Bath Instructions   You can play a key role in reducing the risk of infection after surgery. Your skin needs to be as free of germs as possible. You can reduce the number of germs on your skin by washing with CHG (chlorhexidine gluconate) soap before surgery. CHG is an antiseptic soap that kills germs and continues to kill germs even after washing.   DO NOT use if you have an allergy to chlorhexidine/CHG or antibacterial soaps. If your skin becomes reddened or irritated, stop using the CHG and notify one of our RNs at [phone].   Please shower with the CHG soap starting 4 days before surgery using the following schedule:     Please keep in mind the following:  DO NOT shave, including legs and underarms, starting the day of your first shower.   You may shave your face at any point before/day of surgery.  Place clean sheets on your bed the day you start using CHG soap. Use a clean washcloth (not used since being washed) for each shower. DO NOT sleep with pets once you start using the CHG.   CHG Shower Instructions:  If you choose  to wash your hair and private area, wash first with your normal shampoo/soap.  After you use shampoo/soap, rinse your hair and body thoroughly to remove shampoo/soap residue.  Turn the water OFF and apply about 3 tablespoons (45 ml) of CHG soap to a CLEAN washcloth.  Apply CHG soap ONLY FROM YOUR NECK DOWN TO YOUR TOES (washing for 3-5 minutes)  DO NOT use CHG soap on face, private areas, open wounds, or sores.  Pay special attention to the area where your surgery is being performed.  If you are having back surgery, having someone wash your back for you may be helpful. Wait 2 minutes after CHG soap is applied, then you may rinse off the CHG soap.  Pat dry with a clean towel  Put on clean clothes/pajamas   If you choose to wear lotion, please use ONLY the CHG-compatible lotions on the back of  this paper.     Additional instructions for the day of surgery: DO NOT APPLY any lotions, deodorants, cologne, or perfumes.   Put on clean/comfortable clothes.  Brush your teeth.  Ask your nurse before applying any prescription medications to the skin.      CHG Compatible Lotions   Aveeno Moisturizing lotion  Cetaphil Moisturizing Cream  Cetaphil Moisturizing Lotion  Clairol Herbal Essence Moisturizing Lotion, Dry Skin  Clairol Herbal Essence Moisturizing Lotion, Extra Dry Skin  Clairol Herbal Essence Moisturizing Lotion, Normal Skin  Curel Age Defying Therapeutic Moisturizing Lotion with Alpha Hydroxy  Curel Extreme Care Body Lotion  Curel Soothing Hands Moisturizing Hand Lotion  Curel Therapeutic Moisturizing Cream, Fragrance-Free  Curel Therapeutic Moisturizing Lotion, Fragrance-Free  Curel Therapeutic Moisturizing Lotion, Original Formula  Eucerin Daily Replenishing Lotion  Eucerin Dry Skin Therapy Plus Alpha Hydroxy Crme  Eucerin Dry Skin Therapy Plus Alpha Hydroxy Lotion  Eucerin Original Crme  Eucerin Original Lotion  Eucerin Plus Crme Eucerin Plus Lotion  Eucerin TriLipid  Replenishing Lotion  Keri Anti-Bacterial Hand Lotion  Keri Deep Conditioning Original Lotion Dry Skin Formula Softly Scented  Keri Deep Conditioning Original Lotion, Fragrance Free Sensitive Skin Formula  Keri Lotion Fast Absorbing Fragrance Free Sensitive Skin Formula  Keri Lotion Fast Absorbing Softly Scented Dry Skin Formula  Keri Original Lotion  Keri Skin Renewal Lotion Keri Silky Smooth Lotion  Keri Silky Smooth Sensitive Skin Lotion  Nivea Body Creamy Conditioning Oil  Nivea Body Extra Enriched Teacher, adult education Moisturizing Lotion Nivea Crme  Nivea Skin Firming Lotion  NutraDerm 30 Skin Lotion  NutraDerm Skin Lotion  NutraDerm Therapeutic Skin Cream  NutraDerm Therapeutic Skin Lotion  ProShield Protective Hand Cream  Provon moisturizing lotion

## 2023-06-08 ENCOUNTER — Encounter: Payer: Self-pay | Admitting: Neurosurgery

## 2023-06-08 ENCOUNTER — Ambulatory Visit: Payer: Medicare PPO

## 2023-06-08 ENCOUNTER — Ambulatory Visit
Admission: RE | Admit: 2023-06-08 | Discharge: 2023-06-09 | Disposition: A | Discharge status: Home / Self Care | Payer: Medicare PPO | Attending: Neurosurgery | Admitting: Neurosurgery

## 2023-06-08 ENCOUNTER — Ambulatory Visit: Payer: Medicare PPO | Admitting: General Practice

## 2023-06-08 ENCOUNTER — Other Ambulatory Visit: Payer: Self-pay

## 2023-06-08 ENCOUNTER — Encounter
Admission: RE | Disposition: A | Discharge status: Home / Self Care | Payer: Self-pay | Source: Home / Self Care | Attending: Neurosurgery

## 2023-06-08 ENCOUNTER — Ambulatory Visit: Payer: Medicare PPO | Admitting: Urgent Care

## 2023-06-08 DIAGNOSIS — M50022 Cervical disc disorder at C5-C6 level with myelopathy: Secondary | ICD-10-CM | POA: Diagnosis not present

## 2023-06-08 DIAGNOSIS — M549 Dorsalgia, unspecified: Secondary | ICD-10-CM | POA: Insufficient documentation

## 2023-06-08 DIAGNOSIS — M5010 Cervical disc disorder with radiculopathy, unspecified cervical region: Secondary | ICD-10-CM | POA: Diagnosis present

## 2023-06-08 DIAGNOSIS — M50123 Cervical disc disorder at C6-C7 level with radiculopathy: Secondary | ICD-10-CM

## 2023-06-08 DIAGNOSIS — M4802 Spinal stenosis, cervical region: Secondary | ICD-10-CM | POA: Insufficient documentation

## 2023-06-08 DIAGNOSIS — M79605 Pain in left leg: Secondary | ICD-10-CM | POA: Insufficient documentation

## 2023-06-08 DIAGNOSIS — K219 Gastro-esophageal reflux disease without esophagitis: Secondary | ICD-10-CM | POA: Diagnosis not present

## 2023-06-08 DIAGNOSIS — M5412 Radiculopathy, cervical region: Secondary | ICD-10-CM

## 2023-06-08 DIAGNOSIS — M4722 Other spondylosis with radiculopathy, cervical region: Secondary | ICD-10-CM | POA: Diagnosis not present

## 2023-06-08 DIAGNOSIS — I10 Essential (primary) hypertension: Secondary | ICD-10-CM | POA: Insufficient documentation

## 2023-06-08 DIAGNOSIS — R531 Weakness: Secondary | ICD-10-CM

## 2023-06-08 DIAGNOSIS — R29898 Other symptoms and signs involving the musculoskeletal system: Secondary | ICD-10-CM | POA: Diagnosis not present

## 2023-06-08 DIAGNOSIS — M4712 Other spondylosis with myelopathy, cervical region: Secondary | ICD-10-CM | POA: Insufficient documentation

## 2023-06-08 DIAGNOSIS — E119 Type 2 diabetes mellitus without complications: Secondary | ICD-10-CM

## 2023-06-08 DIAGNOSIS — M79604 Pain in right leg: Secondary | ICD-10-CM | POA: Insufficient documentation

## 2023-06-08 DIAGNOSIS — Z87820 Personal history of traumatic brain injury: Secondary | ICD-10-CM | POA: Diagnosis not present

## 2023-06-08 DIAGNOSIS — R292 Abnormal reflex: Secondary | ICD-10-CM | POA: Diagnosis not present

## 2023-06-08 DIAGNOSIS — M50122 Cervical disc disorder at C5-C6 level with radiculopathy: Secondary | ICD-10-CM | POA: Insufficient documentation

## 2023-06-08 DIAGNOSIS — Z01818 Encounter for other preprocedural examination: Secondary | ICD-10-CM

## 2023-06-08 HISTORY — PX: ANTERIOR CERVICAL DECOMP/DISCECTOMY FUSION: SHX1161

## 2023-06-08 LAB — GLUCOSE, CAPILLARY
Glucose-Capillary: 99 mg/dL (ref 70–99)
Glucose-Capillary: 124 mg/dL — ABNORMAL HIGH (ref 70–99)

## 2023-06-08 LAB — ABO/RH: ABO/RH(D): O POS

## 2023-06-08 SURGERY — ANTERIOR CERVICAL DECOMPRESSION/DISCECTOMY FUSION 2 LEVELS
Anesthesia: General | Site: Spine Cervical

## 2023-06-08 MED ORDER — SODIUM CHLORIDE 0.9 % IV SOLN: 12.5000 mg | INTRAVENOUS | Status: DC | PRN

## 2023-06-08 MED ORDER — CHLORHEXIDINE GLUCONATE 0.12 % MT SOLN: 15.0000 mL | Freq: Once | OROMUCOSAL | Status: DC

## 2023-06-08 MED ORDER — DOCUSATE SODIUM 100 MG PO CAPS
100.0000 mg | ORAL_CAPSULE | Freq: Two times a day (BID) | ORAL | Status: DC
  Administered 2023-06-08 – 2023-06-09 (×2): 100 mg via ORAL
  Filled 2023-06-08: qty 1

## 2023-06-08 MED ORDER — LIDOCAINE HCL (CARDIAC) PF 100 MG/5ML IV SOSY
PREFILLED_SYRINGE | INTRAVENOUS | Status: DC | PRN
  Administered 2023-06-08: 100 mg via INTRAVENOUS

## 2023-06-08 MED ORDER — CHLORHEXIDINE GLUCONATE 0.12 % MT SOLN
OROMUCOSAL | Status: AC
  Filled 2023-06-08: qty 15

## 2023-06-08 MED ORDER — REMIFENTANIL HCL 1 MG IV SOLR
INTRAVENOUS | Status: AC
  Filled 2023-06-08: qty 1000

## 2023-06-08 MED ORDER — AMITRIPTYLINE HCL 25 MG PO TABS
50.0000 mg | ORAL_TABLET | Freq: Every day | ORAL | Status: DC
  Administered 2023-06-08: 50 mg via ORAL
  Filled 2023-06-08: qty 2

## 2023-06-08 MED ORDER — MAGNESIUM CITRATE PO SOLN: 1.0000 | Freq: Once | ORAL | Status: DC | PRN

## 2023-06-08 MED ORDER — FENTANYL CITRATE (PF) 100 MCG/2ML IJ SOLN
INTRAMUSCULAR | Status: AC
  Filled 2023-06-08: qty 2

## 2023-06-08 MED ORDER — CEFAZOLIN SODIUM-DEXTROSE 2-4 GM/100ML-% IV SOLN
2.0000 g | Freq: Four times a day (QID) | INTRAVENOUS | Status: AC
  Administered 2023-06-08 – 2023-06-09 (×2): 2 g via INTRAVENOUS
  Filled 2023-06-08 (×2): qty 100

## 2023-06-08 MED ORDER — GLYCOPYRROLATE 0.2 MG/ML IJ SOLN
INTRAMUSCULAR | Status: DC | PRN
  Administered 2023-06-08: .2 mg via INTRAVENOUS

## 2023-06-08 MED ORDER — EPHEDRINE SULFATE-NACL 50-0.9 MG/10ML-% IV SOSY
PREFILLED_SYRINGE | INTRAVENOUS | Status: DC | PRN
  Administered 2023-06-08: 5 mg via INTRAVENOUS

## 2023-06-08 MED ORDER — ORAL CARE MOUTH RINSE: 15.0000 mL | Freq: Once | OROMUCOSAL | Status: DC

## 2023-06-08 MED ORDER — HYDROMORPHONE HCL 1 MG/ML IJ SOLN
0.5000 mg | INTRAMUSCULAR | Status: DC | PRN
  Administered 2023-06-08: 0.5 mg via INTRAVENOUS

## 2023-06-08 MED ORDER — ONDANSETRON HCL 4 MG/2ML IJ SOLN
INTRAMUSCULAR | Status: DC | PRN
  Administered 2023-06-08 (×2): 4 mg via INTRAVENOUS

## 2023-06-08 MED ORDER — SODIUM CHLORIDE 0.9 % IV SOLN: INTRAVENOUS | Status: DC

## 2023-06-08 MED ORDER — SODIUM CHLORIDE 0.9% FLUSH
3.0000 mL | Freq: Two times a day (BID) | INTRAVENOUS | Status: DC
  Administered 2023-06-08 – 2023-06-09 (×2): 3 mL via INTRAVENOUS

## 2023-06-08 MED ORDER — SODIUM CHLORIDE 0.9 % IV SOLN
INTRAVENOUS | Status: DC | PRN
  Administered 2023-06-08: .2 ug/kg/min via INTRAVENOUS

## 2023-06-08 MED ORDER — LABETALOL HCL 5 MG/ML IV SOLN
5.0000 mg | INTRAVENOUS | Status: DC | PRN
  Administered 2023-06-08 (×2): 5 mg via INTRAVENOUS

## 2023-06-08 MED ORDER — VITAMIN B-12 1000 MCG PO TABS
1000.0000 ug | ORAL_TABLET | Freq: Two times a day (BID) | ORAL | Status: DC
  Administered 2023-06-08 – 2023-06-09 (×2): 1000 ug via ORAL
  Filled 2023-06-08 (×2): qty 1

## 2023-06-08 MED ORDER — ONDANSETRON HCL 4 MG PO TABS: 4.0000 mg | ORAL_TABLET | Freq: Four times a day (QID) | ORAL | Status: DC | PRN

## 2023-06-08 MED ORDER — SODIUM CHLORIDE 0.9 % IV SOLN
250.0000 mL | INTRAVENOUS | Status: DC
  Administered 2023-06-08: 250 mL via INTRAVENOUS

## 2023-06-08 MED ORDER — CEFAZOLIN IN SODIUM CHLORIDE 3-0.9 GM/100ML-% IV SOLN
3.0000 g | INTRAVENOUS | Status: DC
  Filled 2023-06-08: qty 100

## 2023-06-08 MED ORDER — PROPOFOL 500 MG/50ML IV EMUL
INTRAVENOUS | Status: DC | PRN
  Administered 2023-06-08: 165 ug/kg/min via INTRAVENOUS

## 2023-06-08 MED ORDER — ENOXAPARIN SODIUM 40 MG/0.4ML IJ SOSY
40.0000 mg | PREFILLED_SYRINGE | INTRAMUSCULAR | Status: DC
  Administered 2023-06-09: 40 mg via SUBCUTANEOUS
  Filled 2023-06-08: qty 0.4

## 2023-06-08 MED ORDER — PHENYLEPHRINE HCL-NACL 20-0.9 MG/250ML-% IV SOLN
INTRAVENOUS | Status: AC
  Filled 2023-06-08: qty 250

## 2023-06-08 MED ORDER — BISACODYL 5 MG PO TBEC
5.0000 mg | DELAYED_RELEASE_TABLET | Freq: Every day | ORAL | Status: DC | PRN
Start: 2023-06-08 — End: 2023-06-09

## 2023-06-08 MED ORDER — ONDANSETRON HCL 4 MG/2ML IJ SOLN: 4.0000 mg | Freq: Four times a day (QID) | INTRAMUSCULAR | Status: DC | PRN

## 2023-06-08 MED ORDER — SENNOSIDES-DOCUSATE SODIUM 8.6-50 MG PO TABS: 1.0000 | ORAL_TABLET | Freq: Every evening | ORAL | Status: DC | PRN

## 2023-06-08 MED ORDER — METHOCARBAMOL 750 MG PO TABS
ORAL_TABLET | ORAL | Status: AC
  Filled 2023-06-08: qty 1

## 2023-06-08 MED ORDER — ATENOLOL 50 MG PO TABS
ORAL_TABLET | ORAL | Status: AC
  Filled 2023-06-08: qty 1

## 2023-06-08 MED ORDER — CEFAZOLIN SODIUM-DEXTROSE 3-4 GM/150ML-% IV SOLN
3.0000 g | INTRAVENOUS | Status: AC
  Administered 2023-06-08: 3 g via INTRAVENOUS
  Filled 2023-06-08: qty 150

## 2023-06-08 MED ORDER — ACETAMINOPHEN 650 MG RE SUPP: 650.0000 mg | RECTAL | Status: DC | PRN

## 2023-06-08 MED ORDER — HYDROMORPHONE HCL 1 MG/ML IJ SOLN
INTRAMUSCULAR | Status: AC
  Filled 2023-06-08: qty 1

## 2023-06-08 MED ORDER — FENTANYL CITRATE (PF) 100 MCG/2ML IJ SOLN
INTRAMUSCULAR | Status: DC | PRN
  Administered 2023-06-08: 100 ug via INTRAVENOUS

## 2023-06-08 MED ORDER — PANTOPRAZOLE SODIUM 40 MG PO TBEC
40.0000 mg | DELAYED_RELEASE_TABLET | Freq: Every day | ORAL | Status: DC
  Administered 2023-06-09: 40 mg via ORAL
  Filled 2023-06-08: qty 1

## 2023-06-08 MED ORDER — 0.9 % SODIUM CHLORIDE (POUR BTL) OPTIME
TOPICAL | Status: DC | PRN
  Administered 2023-06-08: 500 mL

## 2023-06-08 MED ORDER — ACETAMINOPHEN 325 MG PO TABS: 650.0000 mg | ORAL_TABLET | ORAL | Status: DC | PRN

## 2023-06-08 MED ORDER — OXYCODONE HCL 5 MG PO TABS
10.0000 mg | ORAL_TABLET | ORAL | Status: DC | PRN
  Administered 2023-06-08 – 2023-06-09 (×4): 10 mg via ORAL
  Filled 2023-06-08 (×4): qty 2

## 2023-06-08 MED ORDER — SENNA 8.6 MG PO TABS
1.0000 | ORAL_TABLET | Freq: Two times a day (BID) | ORAL | Status: DC
  Administered 2023-06-08 – 2023-06-09 (×2): 8.6 mg via ORAL
  Filled 2023-06-08: qty 1

## 2023-06-08 MED ORDER — METHOCARBAMOL 500 MG PO TABS
750.0000 mg | ORAL_TABLET | Freq: Three times a day (TID) | ORAL | Status: DC | PRN
  Administered 2023-06-08 – 2023-06-09 (×2): 750 mg via ORAL
  Filled 2023-06-08: qty 2

## 2023-06-08 MED ORDER — LABETALOL HCL 5 MG/ML IV SOLN
INTRAVENOUS | Status: AC
  Filled 2023-06-08: qty 4

## 2023-06-08 MED ORDER — GABAPENTIN 400 MG PO CAPS
1600.0000 mg | ORAL_CAPSULE | Freq: Three times a day (TID) | ORAL | Status: DC
Start: 2023-06-08 — End: 2023-06-09
  Administered 2023-06-08 – 2023-06-09 (×2): 1600 mg via ORAL
  Filled 2023-06-08: qty 4

## 2023-06-08 MED ORDER — BUPIVACAINE-EPINEPHRINE (PF) 0.5% -1:200000 IJ SOLN
INTRAMUSCULAR | Status: DC | PRN
  Administered 2023-06-08: 6 mL

## 2023-06-08 MED ORDER — ACETAMINOPHEN 500 MG PO TABS
1000.0000 mg | ORAL_TABLET | Freq: Four times a day (QID) | ORAL | Status: AC
  Administered 2023-06-08 – 2023-06-09 (×4): 1000 mg via ORAL
  Filled 2023-06-08 (×4): qty 2

## 2023-06-08 MED ORDER — SUCCINYLCHOLINE CHLORIDE 200 MG/10ML IV SOSY
PREFILLED_SYRINGE | INTRAVENOUS | Status: DC | PRN
  Administered 2023-06-08: 100 mg via INTRAVENOUS

## 2023-06-08 MED ORDER — SURGIFLO WITH THROMBIN (HEMOSTATIC MATRIX KIT) OPTIME
TOPICAL | Status: DC | PRN
  Administered 2023-06-08: 1 via TOPICAL

## 2023-06-08 MED ORDER — PHENYLEPHRINE 80 MCG/ML (10ML) SYRINGE FOR IV PUSH (FOR BLOOD PRESSURE SUPPORT)
PREFILLED_SYRINGE | INTRAVENOUS | Status: DC | PRN
  Administered 2023-06-08 (×3): 160 ug via INTRAVENOUS

## 2023-06-08 MED ORDER — METHOCARBAMOL 500 MG PO TABS
ORAL_TABLET | ORAL | Status: AC
Start: 2023-06-08 — End: ?
  Filled 2023-06-08: qty 1

## 2023-06-08 MED ORDER — DOCUSATE SODIUM 100 MG PO CAPS
ORAL_CAPSULE | ORAL | Status: AC
  Filled 2023-06-08: qty 1

## 2023-06-08 MED ORDER — GABAPENTIN 100 MG PO CAPS
ORAL_CAPSULE | ORAL | Status: AC
  Filled 2023-06-08: qty 1

## 2023-06-08 MED ORDER — DROPERIDOL 2.5 MG/ML IJ SOLN: 0.6250 mg | Freq: Once | INTRAMUSCULAR | Status: DC | PRN

## 2023-06-08 MED ORDER — SCOPOLAMINE 1 MG/3DAYS TD PT72
1.0000 | MEDICATED_PATCH | TRANSDERMAL | Status: DC
  Administered 2023-06-08: 1.5 mg via TRANSDERMAL

## 2023-06-08 MED ORDER — PROPOFOL 10 MG/ML IV BOLUS
INTRAVENOUS | Status: DC | PRN
  Administered 2023-06-08: 200 mg via INTRAVENOUS

## 2023-06-08 MED ORDER — ACETAMINOPHEN 10 MG/ML IV SOLN
INTRAVENOUS | Status: AC
  Filled 2023-06-08: qty 100

## 2023-06-08 MED ORDER — PHENYLEPHRINE HCL-NACL 20-0.9 MG/250ML-% IV SOLN
INTRAVENOUS | Status: DC | PRN
  Administered 2023-06-08: 25 ug/min via INTRAVENOUS

## 2023-06-08 MED ORDER — KETAMINE HCL 50 MG/5ML IJ SOSY
PREFILLED_SYRINGE | INTRAMUSCULAR | Status: AC
  Filled 2023-06-08: qty 5

## 2023-06-08 MED ORDER — PHENOL 1.4 % MT LIQD
1.0000 | OROMUCOSAL | Status: DC | PRN
  Filled 2023-06-08: qty 177

## 2023-06-08 MED ORDER — VITAMIN D 25 MCG (1000 UNIT) PO TABS
1000.0000 [IU] | ORAL_TABLET | Freq: Two times a day (BID) | ORAL | Status: DC
  Administered 2023-06-08 – 2023-06-09 (×2): 1000 [IU] via ORAL
  Filled 2023-06-08: qty 1

## 2023-06-08 MED ORDER — MENTHOL 3 MG MT LOZG: 1.0000 | LOZENGE | OROMUCOSAL | Status: DC | PRN

## 2023-06-08 MED ORDER — DEXAMETHASONE SODIUM PHOSPHATE 10 MG/ML IJ SOLN
INTRAMUSCULAR | Status: DC | PRN
  Administered 2023-06-08: 5 mg via INTRAVENOUS

## 2023-06-08 MED ORDER — FENTANYL CITRATE (PF) 100 MCG/2ML IJ SOLN
25.0000 ug | INTRAMUSCULAR | Status: DC | PRN
  Administered 2023-06-08: 50 ug via INTRAVENOUS
  Administered 2023-06-08 (×2): 25 ug via INTRAVENOUS

## 2023-06-08 MED ORDER — MIDAZOLAM HCL 2 MG/2ML IJ SOLN
INTRAMUSCULAR | Status: DC | PRN
  Administered 2023-06-08: 2 mg via INTRAVENOUS

## 2023-06-08 MED ORDER — CEFAZOLIN IN SODIUM CHLORIDE 2-0.9 GM/100ML-% IV SOLN
3.0000 g | Freq: Once | INTRAVENOUS | Status: DC
  Filled 2023-06-08: qty 200

## 2023-06-08 MED ORDER — GABAPENTIN 300 MG PO CAPS
ORAL_CAPSULE | ORAL | Status: AC
  Filled 2023-06-08: qty 5

## 2023-06-08 MED ORDER — PAROXETINE HCL 20 MG PO TABS
20.0000 mg | ORAL_TABLET | Freq: Every day | ORAL | Status: DC
  Administered 2023-06-09: 20 mg via ORAL
  Filled 2023-06-08: qty 1

## 2023-06-08 MED ORDER — SCOPOLAMINE 1 MG/3DAYS TD PT72
MEDICATED_PATCH | TRANSDERMAL | Status: AC
  Filled 2023-06-08: qty 1

## 2023-06-08 MED ORDER — MIDAZOLAM HCL 2 MG/2ML IJ SOLN
INTRAMUSCULAR | Status: AC
  Filled 2023-06-08: qty 2

## 2023-06-08 MED ORDER — SENNA 8.6 MG PO TABS
ORAL_TABLET | ORAL | Status: AC
  Filled 2023-06-08: qty 1

## 2023-06-08 MED ORDER — KETAMINE HCL 10 MG/ML IJ SOLN
INTRAMUSCULAR | Status: DC | PRN
Start: 2023-06-08 — End: 2023-06-08
  Administered 2023-06-08 (×2): 20 mg via INTRAVENOUS
  Administered 2023-06-08: 10 mg via INTRAVENOUS

## 2023-06-08 MED ORDER — SODIUM CHLORIDE 0.9% FLUSH: 3.0000 mL | INTRAVENOUS | Status: DC | PRN

## 2023-06-08 MED ORDER — CEFAZOLIN SODIUM-DEXTROSE 2-4 GM/100ML-% IV SOLN
INTRAVENOUS | Status: AC
  Filled 2023-06-08: qty 100

## 2023-06-08 MED ORDER — OXYCODONE HCL 5 MG PO TABS: 5.0000 mg | ORAL_TABLET | Freq: Once | ORAL | Status: DC | PRN

## 2023-06-08 MED ORDER — VITAMIN D 25 MCG (1000 UNIT) PO TABS
ORAL_TABLET | ORAL | Status: AC
  Filled 2023-06-08: qty 1

## 2023-06-08 MED ORDER — OXYCODONE HCL 5 MG PO TABS: 5.0000 mg | ORAL_TABLET | ORAL | Status: DC | PRN

## 2023-06-08 MED ORDER — OXYCODONE HCL 5 MG/5ML PO SOLN: 5.0000 mg | Freq: Once | ORAL | Status: DC | PRN

## 2023-06-08 MED ORDER — ATENOLOL 25 MG PO TABS
25.0000 mg | ORAL_TABLET | Freq: Every day | ORAL | Status: DC
  Administered 2023-06-09: 25 mg via ORAL
  Filled 2023-06-08: qty 1

## 2023-06-08 MED ORDER — IBUPROFEN 400 MG PO TABS
400.0000 mg | ORAL_TABLET | Freq: Three times a day (TID) | ORAL | Status: DC
  Administered 2023-06-08 – 2023-06-09 (×2): 400 mg via ORAL
  Filled 2023-06-08 (×2): qty 1

## 2023-06-08 MED ORDER — ALUM & MAG HYDROXIDE-SIMETH 200-200-20 MG/5ML PO SUSP: 30.0000 mL | Freq: Four times a day (QID) | ORAL | Status: DC | PRN

## 2023-06-08 SURGICAL SUPPLY — 45 items
BASIN KIT SINGLE STR (MISCELLANEOUS) ×1 IMPLANT
BIT DRILL ACP 13 (DRILL) IMPLANT
BRUSH SCRUB EZ 4% CHG (MISCELLANEOUS) ×1 IMPLANT
BUR NEURO DRILL SOFT 3.0X3.8M (BURR) ×1 IMPLANT
DERMABOND ADVANCED .7 DNX12 (GAUZE/BANDAGES/DRESSINGS) ×1 IMPLANT
DRAIN CHANNEL JP 10F RND 20C F (MISCELLANEOUS) IMPLANT
DRAPE C-ARM XRAY 36X54 (DRAPES) ×2 IMPLANT
DRAPE LAPAROTOMY 77X122 PED (DRAPES) ×1 IMPLANT
DRAPE MICROSCOPE SPINE 48X150 (DRAPES) ×1 IMPLANT
DRILL ACP 13 (DRILL) ×1 IMPLANT
DRSG TEGADERM 2-3/8X2-3/4 SM (GAUZE/BANDAGES/DRESSINGS) ×1 IMPLANT
DRSG TELFA 3X4 N-ADH STERILE (GAUZE/BANDAGES/DRESSINGS) ×1 IMPLANT
ELECT REM PT RETURN 9FT ADLT (ELECTROSURGICAL) ×1 IMPLANT
ELECTRODE REM PT RTRN 9FT ADLT (ELECTROSURGICAL) ×1 IMPLANT
EVACUATOR SILICONE 100CC (DRAIN) IMPLANT
FEE INTRAOP CADWELL SUPPLY NCS (MISCELLANEOUS) IMPLANT
FEE INTRAOP MONITOR IMPULS NCS (MISCELLANEOUS) IMPLANT
GLOVE BIOGEL PI IND STRL 8 (GLOVE) ×1 IMPLANT
GLOVE SRG 8 PF TXTR STRL LF DI (GLOVE) ×1 IMPLANT
GLOVE SURG SYN 7.5 E (GLOVE) ×1 IMPLANT
GLOVE SURG SYN 7.5 PF PI (GLOVE) ×1 IMPLANT
GOWN SRG LRG LVL 4 IMPRV REINF (GOWNS) ×1 IMPLANT
GOWN STRL REUS W/ TWL XL LVL3 (GOWN DISPOSABLE) ×1 IMPLANT
INTRAOP CADWELL SUPPLY FEE NCS (MISCELLANEOUS) IMPLANT
INTRAOP MONITOR FEE IMPULS NCS (MISCELLANEOUS) IMPLANT
KIT TURNOVER KIT A (KITS) ×1 IMPLANT
MANIFOLD NEPTUNE II (INSTRUMENTS) ×1 IMPLANT
NS IRRIG 500ML POUR BTL (IV SOLUTION) ×1 IMPLANT
PACK LAMINECTOMY ARMC (PACKS) ×1 IMPLANT
PAD ARMBOARD 7.5X6 YLW CONV (MISCELLANEOUS) ×2 IMPLANT
PIN ACP TEMP FIXATION (EXFIX) IMPLANT
PIN CASPAR 14 (PIN) ×1 IMPLANT
PIN CASPAR 14MM (PIN) ×1 IMPLANT
PLATE ACP 1.6X36 2LVL (Plate) IMPLANT
SCREW ACP 3.5X17 (Screw) IMPLANT
SPACER CERVICAL FRGE 12X14X5 (Spacer) IMPLANT
SPACER CERVICAL FRGE 12X14X6-7 (Spacer) IMPLANT
SPONGE KITTNER 5P (MISCELLANEOUS) ×1 IMPLANT
SURGIFLO W/THROMBIN 8M KIT (HEMOSTASIS) ×1 IMPLANT
SUT STRATA 3-0 30 PS-1 (SUTURE) IMPLANT
SUT VIC AB 3-0 SH 8-18 (SUTURE) ×1 IMPLANT
SYR 20ML LL LF (SYRINGE) ×1 IMPLANT
TAPE CLOTH 3X10 WHT NS LF (GAUZE/BANDAGES/DRESSINGS) ×3 IMPLANT
TRAP FLUID SMOKE EVACUATOR (MISCELLANEOUS) ×1 IMPLANT
TRAY FOLEY SLVR 16FR LF STAT (SET/KITS/TRAYS/PACK) IMPLANT

## 2023-06-08 NOTE — Anesthesia Postprocedure Evaluation (Signed)
 Anesthesia Post Note  Patient: Clemence C Pinedo  Procedure(s) Performed: C5-7 ANTERIOR CERVICAL DISCECTOMY AND FUSION (FORGE) (Spine Cervical)  Patient location during evaluation: PACU Anesthesia Type: General Level of consciousness: awake and alert Pain management: pain level controlled Vital Signs Assessment: post-procedure vital signs reviewed and stable Respiratory status: spontaneous breathing, nonlabored ventilation, respiratory function stable and patient connected to nasal cannula oxygen Cardiovascular status: blood pressure returned to baseline and stable Postop Assessment: no apparent nausea or vomiting Anesthetic complications: no   No notable events documented.   Last Vitals:  Vitals:   06/08/23 1806 06/08/23 1819  BP:  (!) 152/98  Pulse: 65 72  Resp: 16 17  Temp:  36.6 C  SpO2: 94% 97%    Last Pain:  Vitals:   06/08/23 1819  TempSrc: Oral  PainSc: Asleep                 Yevette Edwards

## 2023-06-08 NOTE — Anesthesia Procedure Notes (Signed)
 Procedure Name: Intubation Date/Time: 06/08/2023 1:50 PM  Performed by: Mohammed Kindle, CRNAPre-anesthesia Checklist: Patient identified, Emergency Drugs available, Suction available and Patient being monitored Patient Re-evaluated:Patient Re-evaluated prior to induction Oxygen Delivery Method: Circle system utilized Preoxygenation: Pre-oxygenation with 100% oxygen Induction Type: IV induction and Cricoid Pressure applied Ventilation: Mask ventilation without difficulty Laryngoscope Size: McGrath and 4 Grade View: Grade II Tube type: Oral Tube size: 7.5 mm Number of attempts: 1 Airway Equipment and Method: Stylet and Oral airway Placement Confirmation: ETT inserted through vocal cords under direct vision, positive ETCO2, breath sounds checked- equal and bilateral and CO2 detector Secured at: 21 cm Tube secured with: Tape Dental Injury: Teeth and Oropharynx as per pre-operative assessment

## 2023-06-08 NOTE — Anesthesia Preprocedure Evaluation (Signed)
 Anesthesia Evaluation  Patient identified by MRN, date of birth, ID band Patient awake    Reviewed: Allergy & Precautions, H&P , NPO status , Patient's Chart, lab work & pertinent test results, reviewed documented beta blocker date and time   History of Anesthesia Complications (+) PONV and history of anesthetic complications  Airway Mallampati: I  TM Distance: >3 FB Neck ROM: full    Dental  (+) Dental Advidsory Given, Teeth Intact, Poor Dentition   Pulmonary neg pulmonary ROS   Pulmonary exam normal        Cardiovascular Exercise Tolerance: Good hypertension, (-) angina (-) CAD, (-) Past MI, (-) Cardiac Stents and (-) CABG negative cardio ROS Normal cardiovascular exam(-) dysrhythmias (-) Valvular Problems/Murmurs     Neuro/Psych negative neurological ROS  negative psych ROS   GI/Hepatic negative GI ROS, Neg liver ROS,GERD  ,,  Endo/Other  negative endocrine ROS    Renal/GU negative Renal ROS  negative genitourinary   Musculoskeletal   Abdominal   Peds  Hematology negative hematology ROS (+)   Anesthesia Other Findings Past Medical History: 09/27/2016: Cellulitis and abscess of foot 05/17/2023: Cervical disc disorder with radiculopathy of cervical  region 03/18/2023: Cervical myelopathy (HCC) 03/18/2023: Gait abnormality No date: Hypertension 05/19/2023: Left arm weakness No date: PONV (postoperative nausea and vomiting) No date: Pre-diabetes 02/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, cervical, with myelopathy  Past Surgical History: No date: BACK SURGERY 09/28/2016: IRRIGATION AND DEBRIDEMENT FOOT; Left     Comment:  Procedure: IRRIGATION AND DEBRIDEMENT FOOT;  Surgeon:               Recardo Evangelist, DPM;  Location: [facility] ORS;  Service:               Podiatry;  Laterality: Left; No date: LEG SURGERY; Left  BMI    Body Mass Index: 32.40 kg/m      Reproductive/Obstetrics negative OB ROS                              Anesthesia Physical Anesthesia Plan  ASA: 2  Anesthesia Plan: General ETT   Post-op Pain Management:    Induction: Intravenous  PONV Risk Score and Plan: 3 and Ondansetron, Dexamethasone, Propofol infusion, TIVA, Midazolam and Scopolamine patch - Pre-op  Airway Management Planned: Oral ETT  Additional Equipment:   Intra-op Plan:   Post-operative Plan: Extubation in OR  Informed Consent: I have reviewed the patients History and Physical, chart, labs and discussed the procedure including the risks, benefits and alternatives for the proposed anesthesia with the patient or authorized representative who has indicated his/her understanding and acceptance.     Dental Advisory Given  Plan Discussed with: Anesthesiologist, CRNA and Surgeon  Anesthesia Plan Comments: (Patient consented for risks of anesthesia including but not limited to:  - adverse reactions to medications - damage to eyes, teeth, lips or other oral mucosa - nerve damage due to positioning  - sore throat or hoarseness - Damage to heart, brain, nerves, lungs, other parts of body or loss of life  Patient voiced understanding and assent.)        Anesthesia Quick Evaluation

## 2023-06-08 NOTE — Interval H&P Note (Signed)
 History and Physical Interval Note:  06/08/2023 1:20 PM  Robert Maldonado  has presented today for surgery, with the diagnosis of M47.12 Spondylosis, cervical, with myelopathy  M50.10 Cervical disc disorder with radiculopathy of cervical region R29.898 Left arm weakness.  The various methods of treatment have been discussed with the patient and family. After consideration of risks, benefits and other options for treatment, the patient has consented to  Procedure(s): C5-7 ANTERIOR CERVICAL DISCECTOMY AND FUSION (FORGE) (N/A) as a surgical intervention.  The patient's history has been reviewed, patient examined, no change in status, stable for surgery.  I have reviewed the patient's chart and labs.  Questions were answered to the patient's satisfaction.    Heart and Lungs are clear.   Lovenia Kim

## 2023-06-08 NOTE — Transfer of Care (Signed)
 Immediate Anesthesia Transfer of Care Note  Patient: Zafar C Clayburn  Procedure(s) Performed: C5-7 ANTERIOR CERVICAL DISCECTOMY AND FUSION (FORGE) (Spine Cervical)  Patient Location: PACU  Anesthesia Type:General  Level of Consciousness: drowsy and patient cooperative  Airway & Oxygen Therapy: Patient Spontanous Breathing and Patient connected to face mask oxygen  Post-op Assessment: Report given to RN and Post -op Vital signs reviewed and stable  Post vital signs: Reviewed and stable  Last Vitals:  Vitals Value Taken Time  BP 174/93 06/08/23 1632  Temp    Pulse 84 06/08/23 1635  Resp 23 06/08/23 1635  SpO2 97 % 06/08/23 1635  Vitals shown include unfiled device data.  Last Pain:  Vitals:   06/08/23 1147  TempSrc: Temporal  PainSc: 8       Patients Stated Pain Goal: 2 (06/08/23 1147)  Complications: No notable events documented.

## 2023-06-08 NOTE — Op Note (Signed)
 Indications: 58 year old man with a history of progressive myeloradiculopathy.  He was find to have EMG evidence of bilateral left worse than right cervical radiculopathy as well as cervical stenosis at the foraminal levels bilaterally.  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   Preoperative Note:   Risks of surgery discussed include: infection, bleeding, stroke, coma, death, paralysis, CSF leak, nerve/spinal cord injury, numbness, tingling, weakness, complex regional pain syndrome, C5 palsy, recurrent stenosis and/or disc herniation, vascular injury, development of instability, neck/back pain, need for further surgery, persistent symptoms, development of deformity, and the risks of anesthesia. The patient understood these risks and agreed to proceed.  Procedure:  1) Anterior cervical diskectomy and fusion at C5-7 2) Anterior cervical instrumentation at C5-7 3) Structural allograft consisting of corticocancellous allograft at C5-C7   Procedure: After obtaining informed consent, the patient taken to the operating room, placed in supine position, general anesthesia induced.  The patient had a small shoulder roll placed behind their shoulders.  The patient received preop antibiotics and IV Decadron.  The patient had a neck incision  outlined, was prepped and draped in usual sterile fashion. The incision was injected with local anesthetic.   An incision was opened, dissection taken down medial to the carotid artery and jugular vein, lateral to the trachea and esophagus.  The prevertebral fascia identified and a localizing x-ray demonstrated the correct level.  The longus colli were dissected laterally, and self-retaining retractors placed to open the operative field. The microscope was then brought into the field.  With this complete, distractor pins were placed in the vertebral bodies of C 5 and C 6. The distractor was placed, and the annulus at C 5/6 was opened using a bovie.  Curettes and pituitary rongeurs used to remove the majority of disk, then the drill was used to remove the posterior osteophyte and begin the foraminotomies. The nerve hook was used to elevate the posterior longitudinal ligament, which was then removed with Kerrison rongeurs. The microblunt nerve hook could be passed out the foramen bilaterally.   Meticulous hemostasis obtained.  Structural allograft was tapped behind the anterior lip of the vertebral body at C 5/6 (5 mm).    The caspar distractor was removed, and bone wax used for hemostasis.  We then turned our attention to the C6-7 level, the C5 Caspar pin was removed and moved into the C7 body.  We then performed an annulotomy at this level.  The disc was resected.  We brought this out to the uncovertebral joints bilaterally.  We continue to follow this deep until we are able to see the posterior osteophytic complex.  Utilizing high-speed drill we were able to remove the posterior osteophytic complex.  We brought this out laterally to decompress the uncovertebral joints and foramen bilaterally.  We are then able to use a upgoing curette to get behind the PLL.  Once we are behind the PLL we are able to decompress  centrally as well as laterally out to the foramen.  Once the decompression was completed we were able  to pass a micro blunt nerve hook out the foramen and showed no ongoing stenosis.  The endplates were meticulously prepared for fusion.  Cartilage was removed.  A separate, 36 mm mm plate was chosen.  Two screws placed in each vertebral body, respectively making sure the screws were behind the locking mechanism.  Final AP and lateral radiographs were taken.   With everything in good position, the wound was irrigated copiously and meticulous hemostasis obtained.  Wound was closed in 2 layers using interrupted inverted 3-0 Vicryl sutures.  The wound was dressed with dermabond, the head of bed at 30 degrees, taken to recovery room in stable condition.  No new postop neurological deficits were identified.  Sponge and pattie counts were correct at the end of the procedure.   I performed the procedure with the assistance of [employer], they aided in the exposure, retraction of critical structures, visualization of critical structures, stabilization during instrumentation, and closure.  Implants: Implant Name Type Inv. Item Serial No. Manufacturer Lot No. LRB No. Used Action  SPACER CERVICAL FRGE [id number] - SNA Spacer SPACER CERVICAL FRGE [id number] NA [employer] [id number] N/A 1 Implanted  SPACER CERVICAL FRGE 12X14X6-7 - SNA Spacer SPACER CERVICAL FRGE [id number] NA [employer] [id number] N/A 1 Implanted  PLATE ACP 2.9F62 2LVL - [id number] Plate PLATE ACP 9.6E95 2LVL  [employer]  N/A 1 Implanted  SCREW ACP 3.5X17 - [id number] Screw SCREW ACP 3.5X17  [employer]  N/A 6 Implanted    Lovenia Kim, MD/MSCR

## 2023-06-09 ENCOUNTER — Encounter: Payer: Self-pay | Admitting: Neurosurgery

## 2023-06-09 DIAGNOSIS — M4712 Other spondylosis with myelopathy, cervical region: Secondary | ICD-10-CM | POA: Diagnosis not present

## 2023-06-09 MED ORDER — TRAMADOL HCL 50 MG PO TABS: 50.0000 mg | ORAL_TABLET | ORAL | Status: DC | PRN

## 2023-06-09 MED ORDER — SENNA 8.6 MG PO TABS: 1.0000 | ORAL_TABLET | Freq: Two times a day (BID) | ORAL | 0 refills | Status: DC

## 2023-06-09 MED ORDER — TRAMADOL HCL 50 MG PO TABS: 50.0000 mg | ORAL_TABLET | ORAL | 0 refills | Status: AC | PRN

## 2023-06-09 MED ORDER — METHOCARBAMOL 750 MG PO TABS: 750.0000 mg | ORAL_TABLET | Freq: Three times a day (TID) | ORAL | 0 refills | Status: AC | PRN

## 2023-06-09 NOTE — Plan of Care (Signed)
  Problem: Pain Management: Goal: Pain level will decrease Outcome: Progressing   Problem: Bladder/Genitourinary: Goal: Urinary functional status for postoperative course will improve Outcome: Progressing   Problem: Elimination: Goal: Will not experience complications related to urinary retention Outcome: Progressing

## 2023-06-09 NOTE — Progress Notes (Signed)
   Neurosurgery Progress Note  History: Ulysess C Platt is s/p C5-7 ACDF   POD1: Is doing relatively well this morning despite posterior neck pain and some throat soreness.  Physical Exam: Vitals:   06/09/23 0055 06/09/23 0411  BP: (!) 144/94 132/89  Pulse: 94 94  Resp: 16 18  Temp: 97.6 F (36.4 C)   SpO2: 96% 95%    AA Ox3 CNI  Strength: 4/5 throughout LUE. 5/5 throughout RUE  Incision c/d/I.  Trachea is midline. Patient phonating with some mild hoarseness  Data:  Other tests/results: NA  Assessment/Plan:  Deontaye C Privette is a 58 year old presenting with cervical stenosis and upper extremity weakness with radiculopathy status post C5-7 ACDF with improvement of radiating arm pain postoperatively.  - mobilize - pain control - DVT prophylaxis - PTOT; dispo planning pending final recommendations  Manning Charity PA-C Department of Neurosurgery
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
## 2023-06-09 NOTE — Discharge Instructions (Signed)
 Your surgeon has performed an operation on your cervical spine (neck) to relieve pressure on the spinal cord and/or nerves. This involved making an incision in the front of your neck and removing one or more of the discs that support your spine. Next, a small piece of bone, a titanium plate, and screws were used to fuse two or more of the vertebrae (bones) together.  The following are instructions to help in your recovery once you have been discharged from the hospital. Even if you feel well, it is important that you follow these activity guidelines. If you do not let your neck heal properly from the surgery, you can increase the chance of return of your symptoms and other complications.  * Do not take anti-inflammatory medications for 3 months after surgery (naproxen [Aleve], ibuprofen [Advil, Motrin], etc.). These medications can prevent your bones from healing properly.  Celebrex, if prescribed, is ok to take.  Activity    No bending, lifting, or twisting ("BLT"). Avoid lifting objects heavier than 10 pounds (gallon milk jug).  Where possible, avoid household activities that involve lifting, bending, reaching, pushing, or pulling such as laundry, vacuuming, grocery shopping, and childcare. Try to arrange for help from friends and family for these activities while your back heals.  Increase physical activity slowly as tolerated.  Taking short walks is encouraged, but avoid strenuous exercise. Do not jog, run, bicycle, lift weights, or participate in any other exercises unless specifically allowed by your doctor.  Talk to your doctor before resuming sexual activity.  You should not drive until cleared by your doctor.  Until released by your doctor, you should not return to work or school.  You should rest at home and let your body heal.   You may shower three days after your surgery.  After showering, lightly dab your incision dry. Do not take a tub bath or go swimming until approved by your  doctor at your follow-up appointment.  If your doctor ordered a cervical collar (neck brace) for you, you should wear it whenever you are out of bed. You may remove it when lying down or sleeping, but you should wear it at all other times. Not all neck surgeries require a cervical collar.  If you smoke, we strongly recommend that you quit.  Smoking has been proven to interfere with normal bone healing and will dramatically reduce the success rate of your surgery. Please contact [facility] (800-QUIT-NOW) and use the resources at www.[facility].com for assistance in stopping smoking.  Surgical Incision   If you have a dressing on your incision, you may remove it two days after your surgery. Keep your incision area clean and dry.  If you have staples or stitches on your incision, you should have a follow up scheduled for removal. If you do not have staples or stitches, you will have steri-strips (small pieces of surgical tape) or Dermabond glue. The steri-strips/glue should begin to peel away within about a week (it is fine if the steri-strips fall off before then). If the strips are still in place one week after your surgery, you may gently remove them.  Diet           You may return to your usual diet. However, you may experience discomfort when swallowing in the first month after your surgery. This is normal. You may find that softer foods are more comfortable for you to swallow. Be sure to stay hydrated.  When to Contact us  You may experience pain in your  neck and/or pain between your shoulder blades. This is normal and should improve in the next few weeks with the help of pain medication, muscle relaxers, and rest. Some patients report that a warm compress on the back of the neck or between the shoulder blades helps.  However, should you experience any of the following, contact us immediately: New numbness or weakness Pain that is progressively getting worse, and is not relieved by your pain  medication, muscle relaxers, rest, and warm compresses Bleeding, redness, swelling, pain, or drainage from surgical incision Chills or flu-like symptoms Fever greater than 101.0 F (38.3 C) Inability to eat, drink fluids, or take medications Problems with bowel or bladder functions Difficulty breathing or shortness of breath Warmth, tenderness, or swelling in your calf Contact Information How to contact us:  If you have any questions/concerns before or after surgery, you can reach [place] at [phone], or you can send a mychart message. We can be reached by phone or mychart 8am-4pm, Monday-Friday.  *Please note: Calls after 4pm are forwarded to a third party answering service. Mychart messages are not routinely monitored during evenings, weekends, and holidays. Please call our office to contact the answering service for urgent concerns during non-business hours.

## 2023-06-09 NOTE — Progress Notes (Signed)
 DISCHARGE NOTE:  Pt dc with IV removed and dc instructions given. Pt's vitals within normal range.Pt voices on concerns or questions at this time. Pt wheeled down to medical mall entrance by staff. Pt significant other provided transportation.

## 2023-06-09 NOTE — Discharge Summary (Signed)
 Discharge Summary  Patient ID: Robert Maldonado MRN: [id number] DOB/AGE: February 12, 1966 58 y.o.  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 His intraoperative course was uncomplicated. He was admitted overnight for monitoring. He was seen and evaluated but therapy and deemed appropriate for discharge home on POD1. He was given prescriptions for Tramadol, Robaxin, and Senna to take as needed.   Consults: None  Significant Diagnostic Studies: NA  Treatments: surgery: As above.  Please see separately dictated operative report for further details.  Discharge Exam: Blood pressure 125/86, pulse 91, temperature 98.1 F (36.7 C), temperature source Oral, resp. rate 15, height 6\' 4"  (1.93 m), weight 120.7 kg, SpO2 98%. AA Ox3 CNI   Strength: 4/5 throughout LUE. 5/5 throughout RUE  Incision c/d/I.  Trachea is midline. Patient phonating with some mild hoarseness    Disposition: Discharge disposition: 01-Home or Self Care       Discharge Instructions     Incentive spirometry RT   Complete by: As directed       Allergies as of 06/09/2023   No Known Allergies      Medication List     STOP taking these medications    diclofenac 75 MG EC tablet Commonly known as: VOLTAREN       TAKE these medications    amitriptyline 50 MG tablet Commonly known as: ELAVIL Take 50 mg by mouth at bedtime.   atenolol 25 MG tablet Commonly known as:  TENORMIN Take 25 mg by mouth daily.   cholecalciferol 1000 units tablet Commonly known as: VITAMIN D Take 1,000 Units by mouth 2 (two) times daily.   cyanocobalamin 1000 MCG tablet Commonly known as: VITAMIN B12 Take 1,000 mcg by mouth 2 (two) times daily.   gabapentin 800 MG tablet Commonly known as: NEURONTIN Take 1,600 mg by mouth 3 (three) times daily.   methocarbamol 750 MG tablet Commonly known as: ROBAXIN Take 1 tablet (750 mg total) by mouth 3 (three) times daily as needed for muscle spasms.   omeprazole 20 MG capsule Commonly known as: PRILOSEC Take 20 mg by mouth daily.   PARoxetine 20 MG tablet Commonly known as: PAXIL Take 20 mg by mouth daily.   Rollator Ultra-Light Misc Gait abnormality   senna 8.6 MG Tabs tablet Commonly known as: SENOKOT Take 1 tablet (8.6 mg total) by mouth 2 (two) times daily.   traMADol 50 MG tablet Commonly known as: ULTRAM Take 1-2 tablets (50-100 mg total) by mouth every 4 (four) hours as needed for moderate pain (pain score 4-6) or severe pain (pain score 7-10).         Signed: Susanne Borders 06/09/2023, 1:42 PM

## 2023-06-09 NOTE — Plan of Care (Signed)
  Problem: Education: Goal: Ability to verbalize activity precautions or restrictions will improve Outcome: Progressing   Problem: Activity: Goal: Ability to avoid complications of mobility impairment will improve Outcome: Progressing   Problem: Clinical Measurements: Goal: Ability to maintain clinical measurements within normal limits will improve Outcome: Progressing   Problem: Health Behavior/Discharge Planning: Goal: Identification of resources available to assist in meeting health care needs will improve Outcome: Progressing   Problem: Health Behavior/Discharge Planning: Goal: Ability to manage health-related needs will improve Outcome: Progressing
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
## 2023-06-21 ENCOUNTER — Encounter: Payer: Self-pay | Admitting: Physician Assistant

## 2023-06-21 ENCOUNTER — Ambulatory Visit (INDEPENDENT_AMBULATORY_CARE_PROVIDER_SITE_OTHER): Payer: Medicare PPO | Admitting: Physician Assistant

## 2023-06-21 VITALS — BP 136/86 | Temp 98.5°F | Ht 76.0 in | Wt 266.0 lb

## 2023-06-21 DIAGNOSIS — M5010 Cervical disc disorder with radiculopathy, unspecified cervical region: Secondary | ICD-10-CM

## 2023-06-21 DIAGNOSIS — Z9889 Other specified postprocedural states: Secondary | ICD-10-CM

## 2023-06-21 DIAGNOSIS — M4712 Other spondylosis with myelopathy, cervical region: Secondary | ICD-10-CM

## 2023-06-21 DIAGNOSIS — Z09 Encounter for follow-up examination after completed treatment for conditions other than malignant neoplasm: Secondary | ICD-10-CM

## 2023-06-21 NOTE — Progress Notes (Signed)
   REFERRING PHYSICIAN:  Danella Penton, Md [address] [facility] [place],  [place] [address]  DOS: 06/08/23, C5-7 ACDF  HISTORY OF PRESENT ILLNESS: Robert Maldonado is 2 weeks status post C5-7 ACDF.  Pain is well-managed and he is back on his chronic pain regimen that he was on prior to surgery to include tramadol and Robaxin as well as gabapentin.  He is moving better and sitting up for a longer period of time.  He also has noticed that he is no longer dropping things with his left hand.  No issues with his gait.  No changes to bowel or bladder incontinence.  PHYSICAL EXAMINATION:  NEUROLOGICAL:  General: In no acute distress.   Awake, alert, oriented to person, place, and time.  Pupils equal round and reactive to light.   Strength: Side Biceps Triceps Deltoid Interossei Grip Wrist Ext. Wrist Flex.  R 5 5 5 5 5 5 5   L 5 5 5 5 5 5 4    Incision c/d/I, skin glue no longer present.  Imaging:  None completed today.  Assessment / Plan: Robert Maldonado is 2 weeks status post C5-7 ACDF.  Pain is well-managed and he is back on his chronic pain regimen that he was on prior to surgery to include tramadol and Robaxin as well as gabapentin.  He is moving better and sitting up for a longer period of time.  He also has noticed that he is no longer dropping things with his left hand.  No issues with his gait.  No changes to bowel or bladder incontinence.  Exam is improved compared to prior to surgery.  He continues to have some left wrist extension weakness.We discussed activity escalation and I have advised the patient to lift up to 10 pounds until 6 weeks after surgery, then increase up to 25 pounds until 12 weeks after surgery.  After 12 weeks post-op, the patient advised to increase activity as tolerated. he will return to clinic in approximately 1 month.    Advised to contact the office if any questions or concerns arise.   Joan Flores PA-C Dept of Neurosurgery

## 2023-07-16 ENCOUNTER — Other Ambulatory Visit: Payer: Self-pay | Admitting: Family Medicine

## 2023-07-16 DIAGNOSIS — M5010 Cervical disc disorder with radiculopathy, unspecified cervical region: Secondary | ICD-10-CM

## 2023-07-19 ENCOUNTER — Encounter
Admission: RE | Admit: 2023-07-19 | Discharge: 2023-07-19 | Disposition: A | Discharge status: Home / Self Care | Source: Home / Self Care | Attending: Neurosurgery | Admitting: Neurosurgery

## 2023-07-19 ENCOUNTER — Ambulatory Visit (INDEPENDENT_AMBULATORY_CARE_PROVIDER_SITE_OTHER): Payer: Medicare PPO | Admitting: Neurosurgery

## 2023-07-19 ENCOUNTER — Ambulatory Visit
Admission: RE | Admit: 2023-07-19 | Discharge: 2023-07-19 | Disposition: A | Discharge status: Home / Self Care | Source: Ambulatory Visit | Attending: Neurosurgery | Admitting: Neurosurgery

## 2023-07-19 VITALS — BP 118/68 | Temp 98.1°F | Ht 76.0 in | Wt 266.0 lb

## 2023-07-19 DIAGNOSIS — M5010 Cervical disc disorder with radiculopathy, unspecified cervical region: Secondary | ICD-10-CM | POA: Diagnosis present

## 2023-07-19 DIAGNOSIS — Z981 Arthrodesis status: Secondary | ICD-10-CM

## 2023-07-20 DIAGNOSIS — Z981 Arthrodesis status: Secondary | ICD-10-CM | POA: Insufficient documentation

## 2023-07-20 NOTE — Progress Notes (Signed)
   REFERRING PHYSICIAN:  Danella Penton, Md [address] [facility] [place],  [place] [address]  DOS: 06/08/23, C5-7 ACDF  HISTORY OF PRESENT ILLNESS: Robert Maldonado is 6 weeks status post C5-7 ACDF.  Overall he feels that he is doing quite well.  Has had improved pain going down into his arms.  He states that he has some intermittent neck pain however it is manageable and better than it was preoperatively.  He noticed that his hands have had increased function.   PHYSICAL EXAMINATION:  NEUROLOGICAL:  General: In no acute distress.   Awake, alert, oriented to person, place, and time.  Pupils equal round and reactive to light.   Strength: Side Biceps Triceps Deltoid Interossei Grip Wrist Ext. Wrist Flex.  R 5 5 5 5 5 5 5   L 5 5 5 5 5 5 4    Incision c/d/I, skin glue no longer present.  Imaging:  Reviewed cervical x-rays, demonstrated good placement of his cervical hardware.  Assessment / Plan: Robert Maldonado is 6 weeks status post C5-7 ACDF.  He feels that he is doing quite well since surgery.  He has not had any new or worsening neck pain.  He Feels like he continues to improve.  His hand function has improved as well.  Overall he feels like he is doing better than he was prior to surgery.  He would like to start to discuss his low back as it has been a nagging problem for him as well.  We let him know that we like him to recover for another few weeks and we will discuss his low back at his 12-week appointment.  Advised to contact the office if any questions or concerns arise.   Lovenia Kim, MD Dept of Neurosurgery

## 2023-07-23 ENCOUNTER — Encounter: Payer: Self-pay | Admitting: Neurosurgery

## 2023-08-25 ENCOUNTER — Encounter: Payer: Self-pay | Admitting: Physician Assistant

## 2023-08-25 ENCOUNTER — Ambulatory Visit
Admission: RE | Admit: 2023-08-25 | Discharge: 2023-08-25 | Disposition: A | Discharge status: Home / Self Care | Source: Ambulatory Visit | Attending: Physician Assistant | Admitting: Physician Assistant

## 2023-08-25 ENCOUNTER — Other Ambulatory Visit: Payer: Self-pay | Admitting: Family Medicine

## 2023-08-25 ENCOUNTER — Ambulatory Visit (INDEPENDENT_AMBULATORY_CARE_PROVIDER_SITE_OTHER): Payer: Medicare PPO | Admitting: Physician Assistant

## 2023-08-25 ENCOUNTER — Ambulatory Visit
Admission: RE | Admit: 2023-08-25 | Discharge: 2023-08-25 | Disposition: A | Discharge status: Home / Self Care | Source: Ambulatory Visit | Attending: Physician Assistant | Admitting: *Deleted

## 2023-08-25 VITALS — BP 126/84 | Temp 98.3°F | Ht 76.0 in | Wt 266.0 lb

## 2023-08-25 DIAGNOSIS — M5010 Cervical disc disorder with radiculopathy, unspecified cervical region: Secondary | ICD-10-CM

## 2023-08-25 DIAGNOSIS — M4716 Other spondylosis with myelopathy, lumbar region: Secondary | ICD-10-CM

## 2023-08-25 DIAGNOSIS — R29898 Other symptoms and signs involving the musculoskeletal system: Secondary | ICD-10-CM

## 2023-08-25 DIAGNOSIS — M25552 Pain in left hip: Secondary | ICD-10-CM

## 2023-08-25 DIAGNOSIS — M25512 Pain in left shoulder: Secondary | ICD-10-CM

## 2023-08-25 DIAGNOSIS — Z09 Encounter for follow-up examination after completed treatment for conditions other than malignant neoplasm: Secondary | ICD-10-CM

## 2023-08-25 DIAGNOSIS — G8929 Other chronic pain: Secondary | ICD-10-CM
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
## 2023-09-14 ENCOUNTER — Ambulatory Visit
Admission: RE | Admit: 2023-09-14 | Discharge: 2023-09-14 | Disposition: A | Discharge status: Home / Self Care | Attending: Physician Assistant | Admitting: Physician Assistant

## 2023-09-14 ENCOUNTER — Ambulatory Visit
Admission: RE | Admit: 2023-09-14 | Discharge: 2023-09-14 | Disposition: A | Discharge status: Home / Self Care | Source: Ambulatory Visit | Attending: Physician Assistant | Admitting: Physician Assistant

## 2023-09-14 DIAGNOSIS — G8929 Other chronic pain: Secondary | ICD-10-CM

## 2023-09-14 DIAGNOSIS — R29898 Other symptoms and signs involving the musculoskeletal system: Secondary | ICD-10-CM | POA: Insufficient documentation

## 2023-09-14 DIAGNOSIS — M25552 Pain in left hip: Secondary | ICD-10-CM | POA: Insufficient documentation

## 2023-09-14 DIAGNOSIS — M25512 Pain in left shoulder: Secondary | ICD-10-CM | POA: Diagnosis present

## 2023-09-15 ENCOUNTER — Encounter: Payer: Self-pay | Admitting: Orthopedic Surgery

## 2023-09-15 ENCOUNTER — Ambulatory Visit: Admitting: Orthopedic Surgery

## 2023-09-15 VITALS — BP 145/87 | Ht 75.0 in | Wt 274.0 lb

## 2023-09-15 DIAGNOSIS — G8929 Other chronic pain: Secondary | ICD-10-CM

## 2023-09-15 DIAGNOSIS — M25512 Pain in left shoulder: Secondary | ICD-10-CM
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
## 2023-09-15 NOTE — Patient Instructions (Signed)
 Instructions Following Joint Injections  In clinic today, you received an injection in one of your joints (sometimes more than one).  Occasionally, you can have some pain at the injection site, this is normal.  You can place ice at the injection site, or take over-the-counter medications such as Tylenol (acetaminophen) or Advil (ibuprofen).  Please follow all directions listed on the bottle.  If your joint (knee or shoulder) becomes swollen, red or very painful, please contact the clinic for additional assistance.   Two medications were injected, including lidocaine and a steroid (often referred to as cortisone).  Lidocaine is effective almost immediately but wears off quickly.  However, the steroid can take a few days to improve your symptoms.  In some cases, it can make your pain worse for a couple of days.  Do not be concerned if this happens as it is common.  You can apply ice or take some over-the-counter medications as needed.   Injections in the same joint cannot be repeated for 3 months.  This helps to limit the risk of an infection in the joint.  If you were to develop an infection in your joint, the best treatment option would be surgery.    Rotator Cuff Tear/Tendinitis Rehab   Ask your health care provider which exercises are safe for you. Do exercises exactly as told by your health care provider and adjust them as directed. It is normal to feel mild stretching, pulling, tightness, or discomfort as you do these exercises. Stop right away if you feel sudden pain or your pain gets worse. Do not begin these exercises until told by your health care provider. Stretching and range-of-motion exercises  These exercises warm up your muscles and joints and improve the movement and flexibility of your shoulder. These exercises also help to relieve pain.  Shoulder pendulum In this exercise, you let the injured arm dangle toward the floor and then swing it like a clock pendulum. Stand near a table  or counter that you can hold onto for balance. Bend forward at the waist and let your left / right arm hang straight down. Use your other arm to support you and help you stay balanced. Relax your left / right arm and shoulder muscles, and move your hips and your trunk so your left / right arm swings freely. Your arm should swing because of the motion of your body, not because you are using your arm or shoulder muscles. Keep moving your hips and trunk so your arm swings in the following directions, as told by your health care provider: Side to side. Forward and backward. In clockwise and counterclockwise circles. Slowly return to the starting position. Repeat 10 times, or for 10 seconds per direction. Complete this exercise 2-3 times a day.      Shoulder flexion, seated This exercise is sometimes called table slides. In this exercise, you raise your arm in front of your body until you feel a stretch in your injured shoulder. Sit in a stable chair so your left / right forearm can rest on a flat surface. Your elbow should rest at a height that keeps your upper arm next to your body. Keeping your left / right shoulder relaxed, lean forward at the waist and let your hand slide forward (flexion). Stop when you feel a stretch in your shoulder, or when you reach the angle that is recommended by your health care provider. Hold for 5 seconds. Slowly return to the starting position. Repeat 10 times. Complete this  exercise 1-2  times a day.       Shoulder flexion, standing In this exercise, you raise your arm in front of your body (flexion) until you feel a stretch in your injured shoulder. Stand and hold a broomstick, a cane, or a similar object. Place your hands a little more than shoulder-width apart on the object. Your left / right hand should be palm-up, and your other hand should be palm-down. Keep your elbow straight and your shoulder muscles relaxed. Push the stick up with your healthy arm to raise  your left / right arm in front of your body, and then over your head until you feel a stretch in your shoulder. Avoid shrugging your shoulder while you raise your arm. Keep your shoulder blade tucked down toward the middle of your back. Keep your left / right shoulder muscles relaxed. Hold for 10 seconds. Slowly return to the starting position. Repeat 10 times. Complete this exercise 1-2 times a day.      Shoulder abduction, active-assisted You will need a stick, broom handle, or similar object to help you (assist) in doing this exercise. Lie on your back. This is the supine position. Hold a broomstick, a cane, or a similar object. Place your hands a little more than shoulder-width apart on the object. Your left / right hand should be palm-up, and your other hand should be palm-down. Keeping your shoulder relaxed, push the stick to raise your left / right arm out to your side (abduction) and then over your head. Use your other hand to help move the stick. Stop when you feel a stretch in your shoulder, or when you reach the angle that is recommended by your health care provider. Avoid shrugging your shoulder while you raise your arm. Keep your shoulder blade tucked down toward the middle of your back. Hold for 10 seconds. Slowly return to the starting position. Repeat 10 times. Complete this exercise 1-2 times a day.      Shoulder flexion, active-assisted Lie on your back. You may bend your knees for comfort. Hold a broomstick, a cane, or a similar object so that your hands are about shoulder-width apart. Your palms should face toward your feet. Raise your left / right arm over your head, then behind your head toward the floor (flexion). Use your other hand to help you do this (active-assisted). Stop when you feel a gentle stretch in your shoulder, or when you reach the angle that is recommended by your health care provider. Hold for 10 seconds. Use the stick and your other arm to help you  return your left / right arm to the starting position. Repeat 10 times. Complete this exercise 1-2 times a day.      External rotation Sit in a stable chair without armrests, or stand up. Tuck a soft object, such as a folded towel or a small ball, under your left / right upper arm. Hold a broomstick, a cane, or a similar object with your palms face-down, toward the floor. Bend your elbows to a 90-degree angle (right angle), and keep your hands about shoulder-width apart. Straighten your healthy arm and push the stick across your body, toward your left / right side. Keep your left / right arm bent. This will rotate your left / right forearm away from your body (external rotation). Hold for 10 seconds. Slowly return to the starting position. Repeat 10 times. Complete this exercise 1-2 times a day.        Strengthening exercises  These exercises build strength and endurance in your shoulder. Endurance is the ability to use your muscles for a long time, even after they get tired. Do not start doing these exercises until your health care provider approves. Shoulder flexion, isometric Stand or sit in a doorway, facing the door frame. Keep your left / right arm straight and make a gentle fist with your hand. Place your fist against the door frame. Only your fist should be touching the frame. Keep your upper arm at your side. Gently press your fist against the door frame, as if you are trying to raise your arm above your head (isometric shoulder flexion). Avoid shrugging your shoulder while you press your hand into the door frame. Keep your shoulder blade tucked down toward the middle of your back. Hold for 10 seconds. Slowly release the tension, and relax your muscles completely before you repeat the exercise. Repeat 10 times. Complete this exercise 3 times per week.      Shoulder abduction, isometric Stand or sit in a doorway. Your left / right arm should be closest to the door frame. Keep your  left / right arm straight, and place the back of your hand against the door frame. Only your hand should be touching the frame. Keep the rest of your arm close to your side. Gently press the back of your hand against the door frame, as if you are trying to raise your arm out to the side (isometric shoulder abduction). Avoid shrugging your shoulder while you press your hand into the door frame. Keep your shoulder blade tucked down toward the middle of your back. Hold for 10 seconds. Slowly release the tension, and relax your muscles completely before you repeat the exercise. Repeat 10 times. Complete this exercise 3 times per week.      Internal rotation, isometric This is an exercise in which you press your palm against a door frame without moving your shoulder joint (isometric). Stand or sit in a doorway, facing the door frame. Bend your left / right elbow, and place the palm of your hand against the door frame. Only your palm should be touching the frame. Keep your upper arm at your side. Gently press your hand against the door frame, as if you are trying to push your arm toward your abdomen (internal rotation). Gradually increase the pressure until you are pressing as hard as you can. Stop increasing the pressure if you feel shoulder pain. Avoid shrugging your shoulder while you press your hand into the door frame. Keep your shoulder blade tucked down toward the middle of your back. Hold for 10 seconds. Slowly release the tension, and relax your muscles completely before you repeat the exercise. Repeat 10 times. Complete this exercise 3 times per week.      External rotation, isometric This is an exercise in which you press the back of your wrist against a door frame without moving your shoulder joint (isometric). Stand or sit in a doorway, facing the door frame. Bend your left / right elbow and place the back of your wrist against the door frame. Only the back of your wrist should be  touching the frame. Keep your upper arm at your side. Gently press your wrist against the door frame, as if you are trying to push your arm away from your abdomen (external rotation). Gradually increase the pressure until you are pressing as hard as you can. Stop increasing the pressure if you feel pain. Avoid shrugging your shoulder while  you press your wrist into the door frame. Keep your shoulder blade tucked down toward the middle of your back. Hold for 10 seconds. Slowly release the tension, and relax your muscles completely before you repeat the exercise. Repeat 10 times. Complete this exercise 3 times per week.       Scapular retraction Sit in a stable chair without armrests, or stand up. Secure an exercise band to a stable object in front of you so the band is at shoulder height. Hold one end of the exercise band in each hand. Your palms should face down. Squeeze your shoulder blades together (retraction) and move your elbows slightly behind you. Do not shrug your shoulders upward while you do this. Hold for 10 seconds. Slowly return to the starting position. Repeat 10 times. Complete this exercise 3 times per week.      Shoulder extension Sit in a stable chair without armrests, or stand up. Secure an exercise band to a stable object in front of you so the band is above shoulder height. Hold one end of the exercise band in each hand. Straighten your elbows and lift your hands up to shoulder height. Squeeze your shoulder blades together and pull your hands down to the sides of your thighs (extension). Stop when your hands are straight down by your sides. Do not let your hands go behind your body. Hold for 10 seconds. Slowly return to the starting position. Repeat 10 times. Complete this exercise 3 times per week.       Scapular protraction, supine Lie on your back on a firm surface (supine position). Hold a 5 lbs (or soup can) weight in your left / right hand. Raise your left  / right arm straight into the air so your hand is directly above your shoulder joint. Push the weight into the air so your shoulder (scapula) lifts off the surface that you are lying on. The scapula will push up or forward (protraction). Do not move your head, neck, or back. Hold for 10 seconds. Slowly return to the starting position. Let your muscles relax completely before you repeat this exercise.  Repeat 10 times. Complete this exercise 3 times per week.

## 2023-10-11 ENCOUNTER — Encounter: Payer: Self-pay | Admitting: Physical Therapy

## 2023-10-11 ENCOUNTER — Ambulatory Visit: Attending: Physician Assistant | Admitting: Physical Therapy

## 2023-10-11 DIAGNOSIS — M25552 Pain in left hip: Secondary | ICD-10-CM | POA: Diagnosis present

## 2023-10-11 DIAGNOSIS — M4716 Other spondylosis with myelopathy, lumbar region: Secondary | ICD-10-CM | POA: Insufficient documentation

## 2023-10-11 DIAGNOSIS — M6281 Muscle weakness (generalized): Secondary | ICD-10-CM | POA: Insufficient documentation

## 2023-10-11 DIAGNOSIS — M5459 Other low back pain: Secondary | ICD-10-CM | POA: Insufficient documentation

## 2023-10-11 DIAGNOSIS — R262 Difficulty in walking, not elsewhere classified: Secondary | ICD-10-CM | POA: Diagnosis present
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
## 2023-10-12 NOTE — Addendum Note (Signed)
 Addended by: ANDRA DITCH T on: 10/12/2023 11:04 AM   Modules accepted: Orders

## 2023-10-13 ENCOUNTER — Encounter: Admitting: Physical Therapy

## 2023-10-14 ENCOUNTER — Ambulatory Visit: Attending: Physician Assistant | Admitting: Physical Therapy

## 2023-10-14 DIAGNOSIS — M5459 Other low back pain: Secondary | ICD-10-CM | POA: Insufficient documentation

## 2023-10-14 DIAGNOSIS — M6281 Muscle weakness (generalized): Secondary | ICD-10-CM | POA: Insufficient documentation

## 2023-10-14 DIAGNOSIS — M25552 Pain in left hip: Secondary | ICD-10-CM | POA: Diagnosis present

## 2023-10-14 DIAGNOSIS — R262 Difficulty in walking, not elsewhere classified: Secondary | ICD-10-CM | POA: Insufficient documentation
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
## 2023-10-18 ENCOUNTER — Ambulatory Visit: Admitting: Physical Therapy

## 2023-10-18 ENCOUNTER — Encounter: Payer: Self-pay | Admitting: Physical Therapy

## 2023-10-18 DIAGNOSIS — R262 Difficulty in walking, not elsewhere classified: Secondary | ICD-10-CM

## 2023-10-18 DIAGNOSIS — M6281 Muscle weakness (generalized): Secondary | ICD-10-CM

## 2023-10-18 DIAGNOSIS — M5459 Other low back pain: Secondary | ICD-10-CM

## 2023-10-18 DIAGNOSIS — M25552 Pain in left hip: Secondary | ICD-10-CM
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
## 2023-10-19 ENCOUNTER — Encounter: Payer: Self-pay | Admitting: Physician Assistant

## 2023-10-19 ENCOUNTER — Ambulatory Visit (INDEPENDENT_AMBULATORY_CARE_PROVIDER_SITE_OTHER): Admitting: Physician Assistant

## 2023-10-19 VITALS — BP 130/72 | Ht 76.0 in | Wt 271.0 lb

## 2023-10-19 DIAGNOSIS — M4716 Other spondylosis with myelopathy, lumbar region: Secondary | ICD-10-CM

## 2023-10-19 DIAGNOSIS — M25552 Pain in left hip: Secondary | ICD-10-CM

## 2023-10-19 DIAGNOSIS — M21372 Foot drop, left foot: Secondary | ICD-10-CM | POA: Diagnosis not present

## 2023-10-19 DIAGNOSIS — M545 Low back pain, unspecified: Secondary | ICD-10-CM

## 2023-10-19 DIAGNOSIS — R29898 Other symptoms and signs involving the musculoskeletal system: Secondary | ICD-10-CM

## 2023-10-19 DIAGNOSIS — M48061 Spinal stenosis, lumbar region without neurogenic claudication: Secondary | ICD-10-CM | POA: Diagnosis not present

## 2023-10-19 DIAGNOSIS — Z87828 Personal history of other (healed) physical injury and trauma: Secondary | ICD-10-CM

## 2023-10-19 DIAGNOSIS — M79605 Pain in left leg: Secondary | ICD-10-CM
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
## 2023-10-20 ENCOUNTER — Ambulatory Visit: Admitting: Physical Therapy

## 2023-10-20 DIAGNOSIS — R262 Difficulty in walking, not elsewhere classified: Secondary | ICD-10-CM

## 2023-10-20 DIAGNOSIS — M5459 Other low back pain: Secondary | ICD-10-CM

## 2023-10-20 DIAGNOSIS — M25552 Pain in left hip: Secondary | ICD-10-CM

## 2023-10-20 DIAGNOSIS — M6281 Muscle weakness (generalized): Secondary | ICD-10-CM
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
## 2023-10-21 ENCOUNTER — Encounter: Payer: Self-pay | Admitting: Physical Therapy

## 2023-10-25 ENCOUNTER — Ambulatory Visit: Admitting: Physical Therapy

## 2023-10-25 ENCOUNTER — Encounter: Payer: Self-pay | Admitting: Physical Therapy

## 2023-10-25 DIAGNOSIS — R262 Difficulty in walking, not elsewhere classified: Secondary | ICD-10-CM

## 2023-10-25 DIAGNOSIS — M5459 Other low back pain: Secondary | ICD-10-CM

## 2023-10-25 DIAGNOSIS — M6281 Muscle weakness (generalized): Secondary | ICD-10-CM

## 2023-10-25 DIAGNOSIS — M25552 Pain in left hip: Secondary | ICD-10-CM
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
## 2023-10-27 ENCOUNTER — Encounter: Admitting: Physical Therapy

## 2023-10-28 ENCOUNTER — Ambulatory Visit: Admitting: Physical Therapy

## 2023-10-28 ENCOUNTER — Encounter: Payer: Self-pay | Admitting: Physical Therapy

## 2023-10-28 DIAGNOSIS — M5459 Other low back pain: Secondary | ICD-10-CM

## 2023-10-28 DIAGNOSIS — R262 Difficulty in walking, not elsewhere classified: Secondary | ICD-10-CM

## 2023-10-28 DIAGNOSIS — M6281 Muscle weakness (generalized): Secondary | ICD-10-CM

## 2023-10-28 DIAGNOSIS — M25552 Pain in left hip: Secondary | ICD-10-CM
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
## 2023-11-01 ENCOUNTER — Ambulatory Visit: Admitting: Physical Therapy

## 2023-11-01 ENCOUNTER — Encounter: Payer: Self-pay | Admitting: Physical Therapy

## 2023-11-01 DIAGNOSIS — R262 Difficulty in walking, not elsewhere classified: Secondary | ICD-10-CM

## 2023-11-01 DIAGNOSIS — M5459 Other low back pain: Secondary | ICD-10-CM | POA: Diagnosis not present

## 2023-11-01 DIAGNOSIS — M25552 Pain in left hip: Secondary | ICD-10-CM

## 2023-11-01 DIAGNOSIS — M6281 Muscle weakness (generalized): Secondary | ICD-10-CM
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
## 2023-11-03 ENCOUNTER — Encounter: Admitting: Physical Therapy

## 2023-11-04 ENCOUNTER — Ambulatory Visit: Admitting: Physical Therapy
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
## 2023-11-05 ENCOUNTER — Encounter: Payer: Self-pay | Admitting: Neurosurgery

## 2023-11-08 ENCOUNTER — Encounter: Payer: Self-pay | Admitting: Physical Therapy

## 2023-11-08 ENCOUNTER — Ambulatory Visit: Admitting: Physical Therapy

## 2023-11-08 DIAGNOSIS — R262 Difficulty in walking, not elsewhere classified: Secondary | ICD-10-CM

## 2023-11-08 DIAGNOSIS — M25552 Pain in left hip: Secondary | ICD-10-CM

## 2023-11-08 DIAGNOSIS — M6281 Muscle weakness (generalized): Secondary | ICD-10-CM

## 2023-11-08 DIAGNOSIS — M5459 Other low back pain: Secondary | ICD-10-CM | POA: Diagnosis not present
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
## 2023-11-10 ENCOUNTER — Encounter: Admitting: Physical Therapy

## 2023-11-11 ENCOUNTER — Encounter: Payer: Self-pay | Admitting: Physical Therapy

## 2023-11-11 ENCOUNTER — Ambulatory Visit: Admitting: Physical Therapy

## 2023-11-11 DIAGNOSIS — M5459 Other low back pain: Secondary | ICD-10-CM | POA: Diagnosis not present

## 2023-11-11 DIAGNOSIS — M25552 Pain in left hip: Secondary | ICD-10-CM

## 2023-11-11 DIAGNOSIS — R262 Difficulty in walking, not elsewhere classified: Secondary | ICD-10-CM

## 2023-11-11 DIAGNOSIS — M6281 Muscle weakness (generalized): Secondary | ICD-10-CM
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
## 2023-11-15 ENCOUNTER — Ambulatory Visit: Attending: Physician Assistant | Admitting: Physical Therapy

## 2023-11-15 ENCOUNTER — Encounter: Payer: Self-pay | Admitting: Physical Therapy

## 2023-11-15 DIAGNOSIS — M6281 Muscle weakness (generalized): Secondary | ICD-10-CM | POA: Insufficient documentation

## 2023-11-15 DIAGNOSIS — M5459 Other low back pain: Secondary | ICD-10-CM | POA: Diagnosis present

## 2023-11-15 DIAGNOSIS — M25552 Pain in left hip: Secondary | ICD-10-CM | POA: Insufficient documentation

## 2023-11-15 DIAGNOSIS — R262 Difficulty in walking, not elsewhere classified: Secondary | ICD-10-CM | POA: Insufficient documentation
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
## 2023-11-17 ENCOUNTER — Encounter: Admitting: Physical Therapy

## 2023-11-18 ENCOUNTER — Ambulatory Visit: Admitting: Physical Therapy

## 2023-11-18 DIAGNOSIS — M25552 Pain in left hip: Secondary | ICD-10-CM

## 2023-11-18 DIAGNOSIS — M5459 Other low back pain: Secondary | ICD-10-CM | POA: Diagnosis not present

## 2023-11-18 DIAGNOSIS — R262 Difficulty in walking, not elsewhere classified: Secondary | ICD-10-CM

## 2023-11-18 DIAGNOSIS — M6281 Muscle weakness (generalized): Secondary | ICD-10-CM
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
## 2023-11-19 ENCOUNTER — Encounter: Payer: Self-pay | Admitting: Physical Therapy

## 2023-11-22 ENCOUNTER — Ambulatory Visit: Admitting: Physical Therapy

## 2023-11-22 DIAGNOSIS — M5459 Other low back pain: Secondary | ICD-10-CM | POA: Diagnosis not present

## 2023-11-22 DIAGNOSIS — R262 Difficulty in walking, not elsewhere classified: Secondary | ICD-10-CM

## 2023-11-22 DIAGNOSIS — M6281 Muscle weakness (generalized): Secondary | ICD-10-CM

## 2023-11-22 DIAGNOSIS — M25552 Pain in left hip: Secondary | ICD-10-CM
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
## 2023-11-24 ENCOUNTER — Encounter: Payer: Self-pay | Admitting: Physical Therapy

## 2023-11-24 ENCOUNTER — Ambulatory Visit: Admitting: Physical Therapy

## 2023-11-24 DIAGNOSIS — M6281 Muscle weakness (generalized): Secondary | ICD-10-CM

## 2023-11-24 DIAGNOSIS — M5459 Other low back pain: Secondary | ICD-10-CM

## 2023-11-24 DIAGNOSIS — R262 Difficulty in walking, not elsewhere classified: Secondary | ICD-10-CM

## 2023-11-24 DIAGNOSIS — M25552 Pain in left hip: Secondary | ICD-10-CM
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
## 2023-11-29 ENCOUNTER — Ambulatory Visit: Admitting: Physical Therapy

## 2023-11-29 DIAGNOSIS — M5459 Other low back pain: Secondary | ICD-10-CM

## 2023-11-29 DIAGNOSIS — R262 Difficulty in walking, not elsewhere classified: Secondary | ICD-10-CM

## 2023-11-29 DIAGNOSIS — M6281 Muscle weakness (generalized): Secondary | ICD-10-CM

## 2023-11-29 DIAGNOSIS — M25552 Pain in left hip: Secondary | ICD-10-CM
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
## 2023-11-30 ENCOUNTER — Encounter: Payer: Self-pay | Admitting: Physical Therapy

## 2023-12-03 NOTE — Progress Notes (Signed)
   REFERRING PHYSICIAN:  Cleotilde Oneil FALCON, Md [address] [facility] [place],  [place] [address]  DOS: 06/08/23, C5-7 ACDF  HISTORY OF PRESENT ILLNESS: Robert Maldonado previously underwent C5-7 ACDF approximately 6 months ago and comes in today for continued left leg weakness and pain.  He continues to have a left foot drop and pain that extends from his back to his left hip and down his leg.  He has done multiple sessions of physical therapy which she feels those helped some however his left foot drop has now been ongoing for 4 to 5 months.  He continues to have severe pain going on that leg as well.  Also has significant back pain.  PHYSICAL EXAMINATION:  NEUROLOGICAL:  General: In no acute distress.   Awake, alert, oriented to person, place, and time.  Pupils equal round and reactive to light.   Patient tender to palpation about his left shoulder.  Also pain with passive range of motion maneuvers indicative of potential rotator cuff pathology.  Strength: Weakness noted in left hip flexion (3) as well as left dorsiflexion (2) and plantarflexion (4-).  Worse in left dorsiflexion and EHL.  Pain with internal and external rotation of the hip.  Incision c/d/I.  Well-healed.  Gait altered secondary to left foot drop.  Imaging:   IMPRESSION: 1. Multilevel lumbar spondylosis, epidural lipomatosis and facet arthropathy, worst at L3-4 where there is severe narrowing of the thecal sac and moderate right neural foraminal narrowing. 2. Moderate-to-severe narrowing of the thecal sac at L4-5 and L5-S1. 3. Moderate bilateral facet arthropathy at L5-S1 with periarticular edema, which can be a source of pain.     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  He continues to have pain in his left low back that  extends to his left hip and down his leg.  On physical examination he continues to have a significant left-sided foot drop.  He has not had any improvement with conservative management.  He has been dealing with this for approximately 5 to 6 months.  His MRI shows a pars defect with significant spinal stenosis as well as spondylolisthesis.  This is also causing severe stenosis.  I will plan on getting flexion-extension x-rays as well as scoliosis x-rays to evaluate for any mobile spondylolisthesis and spinal deformity.  Will plan to discuss surgical fusion versus decompression alone.  Patient does have significant amount of back pain.  Once we have this imaging we will be able to go forward with surgical planning.  We did discuss that an anterior approach could put him at increased risk for retrograde ejaculation, he is quite hesitant to move forward with anterior approach.  Will discuss lateral versus posterior approach if spinal fusion is necessary.  Advised to contact the office if any questions or concerns arise.   Penne LELON Sharps MD Dept of Neurosurgery

## 2023-12-06 ENCOUNTER — Ambulatory Visit: Admitting: Physical Therapy

## 2023-12-06 DIAGNOSIS — M6281 Muscle weakness (generalized): Secondary | ICD-10-CM

## 2023-12-06 DIAGNOSIS — R262 Difficulty in walking, not elsewhere classified: Secondary | ICD-10-CM

## 2023-12-06 DIAGNOSIS — M5459 Other low back pain: Secondary | ICD-10-CM

## 2023-12-06 DIAGNOSIS — M25552 Pain in left hip: Secondary | ICD-10-CM
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
## 2023-12-08 ENCOUNTER — Ambulatory Visit
Admission: RE | Admit: 2023-12-08 | Discharge: 2023-12-08 | Disposition: A | Discharge status: Home / Self Care | Source: Ambulatory Visit | Attending: Neurosurgery | Admitting: Neurosurgery

## 2023-12-08 ENCOUNTER — Ambulatory Visit (INDEPENDENT_AMBULATORY_CARE_PROVIDER_SITE_OTHER): Admitting: Neurosurgery

## 2023-12-08 VITALS — BP 102/66 | Ht 76.0 in | Wt 271.0 lb

## 2023-12-08 DIAGNOSIS — R29898 Other symptoms and signs involving the musculoskeletal system: Secondary | ICD-10-CM

## 2023-12-08 DIAGNOSIS — M4316 Spondylolisthesis, lumbar region: Secondary | ICD-10-CM | POA: Diagnosis not present

## 2023-12-08 DIAGNOSIS — M48061 Spinal stenosis, lumbar region without neurogenic claudication: Secondary | ICD-10-CM | POA: Diagnosis not present

## 2023-12-08 DIAGNOSIS — M4716 Other spondylosis with myelopathy, lumbar region: Secondary | ICD-10-CM

## 2023-12-08 DIAGNOSIS — M21372 Foot drop, left foot: Secondary | ICD-10-CM | POA: Diagnosis not present

## 2023-12-28 DIAGNOSIS — R29898 Other symptoms and signs involving the musculoskeletal system: Secondary | ICD-10-CM

## 2023-12-28 DIAGNOSIS — M4716 Other spondylosis with myelopathy, lumbar region: Secondary | ICD-10-CM

## 2024-01-03 NOTE — Telephone Encounter (Signed)
 Patient's significant other, Nat called to follow up on an MRI that was supposed to be ordered. Please advise.

## 2024-01-06 NOTE — Telephone Encounter (Signed)
 Order is in. Mri lumbar, updated since his is over a year old

## 2024-01-06 NOTE — Telephone Encounter (Signed)
 Nat is following up about this mri, are there any updates?

## 2024-01-09 ENCOUNTER — Ambulatory Visit
Admission: RE | Admit: 2024-01-09 | Discharge: 2024-01-09 | Disposition: A | Discharge status: Home / Self Care | Source: Ambulatory Visit | Attending: Neurosurgery | Admitting: Neurosurgery

## 2024-01-09 DIAGNOSIS — M4716 Other spondylosis with myelopathy, lumbar region: Secondary | ICD-10-CM | POA: Insufficient documentation

## 2024-01-11 ENCOUNTER — Ambulatory Visit: Payer: Self-pay | Admitting: Neurosurgery

## 2024-01-21 NOTE — Telephone Encounter (Signed)
 Patient advised and scheduled.

## 2024-01-25 ENCOUNTER — Telehealth: Payer: Self-pay

## 2024-01-25 ENCOUNTER — Ambulatory Visit: Payer: Self-pay | Admitting: Neurosurgery

## 2024-01-25 ENCOUNTER — Ambulatory Visit: Admitting: Neurosurgery

## 2024-01-25 DIAGNOSIS — M48062 Spinal stenosis, lumbar region with neurogenic claudication: Secondary | ICD-10-CM | POA: Insufficient documentation

## 2024-01-25 DIAGNOSIS — M4716 Other spondylosis with myelopathy, lumbar region: Secondary | ICD-10-CM

## 2024-01-25 DIAGNOSIS — R29898 Other symptoms and signs involving the musculoskeletal system: Secondary | ICD-10-CM

## 2024-01-25 NOTE — Telephone Encounter (Signed)
 L3-S1 laminectomy, Received: Today Robert Maldonado ORN, MD  P [facility]-Neurosurgery Rn They cannot do November 6 to November 10

## 2024-01-25 NOTE — Progress Notes (Signed)
 Had a follow-up phone call today with Mr. Mceachern.  He was at home and I was in the office.  He and his partner gave consent to go forward with a phone visit to discuss his lumbar stenosis.  We discussed that his imaging did not show a significant amount of instability and that he could potentially benefit from a decompression alone rather than a decompression and spinal fusion.  His anatomy does appear to be amenable to a midline approach for decompression and that that we will adequately decompress the traversing and exiting nerve roots given their location of their stenosis.  They would like to go forward with the procedure given his progressive lower extremity claudication as well as his left lower extremity foot drop which has not had any recovery.  We will plan on an L3-S1 lumbar laminectomy and lateral recess decompression.  We discussed risks and benefits of surgery.  We also discussed that he would be at risk for requiring a spinal fusion in the future but that a decompression alone would be a lesser recovery than a upfront spinal fusion.  Given his progressive deficit, neurogenic claudication, and lack of improvement with time and observation we will plan on a lumbar laminectomy as described above.  I spent a total of 10 minutes on the phone call today.  Penne MICAEL Sharps, MD

## 2024-01-27 ENCOUNTER — Other Ambulatory Visit: Payer: Self-pay

## 2024-01-27 DIAGNOSIS — M4716 Other spondylosis with myelopathy, lumbar region: Secondary | ICD-10-CM

## 2024-01-27 DIAGNOSIS — R29898 Other symptoms and signs involving the musculoskeletal system: Secondary | ICD-10-CM

## 2024-01-27 DIAGNOSIS — Z01818 Encounter for other preprocedural examination: Secondary | ICD-10-CM

## 2024-01-27 NOTE — Telephone Encounter (Signed)
Called and left voicemail regarding surgery scheduling.

## 2024-01-27 NOTE — Telephone Encounter (Signed)
 Patient returned call to schedule surgery. Discussed surgical instructions as listed below  Please see below for information in regards to your upcoming surgery:   Planned surgery: Lumbar laminectomy medial facetectomy and lateral recess decompression, L3-S1    Surgery date: 03/02/24  at [facility] (Medical Mall: [address] [place], [place] [address]) - you will find out your arrival time the business day before your surgery.    Pre-op appointment at [facility] Pre-admit Testing: you will receive a call with a date/time for this appointment. If you are scheduled for an in person appointment, Pre-admit Testing is located on the first floor of the [facility] Arts building, [address] [facility] [address], [address]. During this appointment, they will advise you which medications you can take the morning of surgery, and which medications you will need to hold for surgery. Labs (such as blood work, EKG) may be done at your pre-op appointment. You are not required to fast for these labs. Should you need to change your pre-op appointment, please call Pre-admit testing at [phone].       Diabetes/heart failure/kidney disease/weight loss medications that require an extended hold: Per anesthesia guidelines (due to the increased risk of aspiration caused by delayed gastric emptying):  Jardiance - should be held for 3 days prior to surgery      Common restrictions after spine surgery: No bending, lifting, or twisting ("BLT"). Avoid lifting objects heavier than 10 pounds for the first 6 weeks after surgery. Where possible, avoid household activities that involve lifting, bending, reaching, pushing, or pulling such as laundry, vacuuming, grocery shopping, and childcare. Try to arrange for help from friends and family for these activities while you heal. Do not drive while taking prescription pain medication. Weeks 6 through 12 after surgery: avoid lifting more than 25  pounds.       How to contact us :  If you have any questions/concerns before or after surgery, you can reach us  at [phone], or you can send a mychart message. We can be reached by phone or mychart 8am-4pm, Monday-Friday.  *Please note: Calls after 4pm are forwarded to a third party answering service. Mychart messages are not routinely monitored during evenings, weekends, and holidays. Please call our office to contact the answering service for urgent concerns during non-business hours.     If you have FMLA/disability paperwork, please drop it off or fax it to [phone]   Appointments/FMLA & disability paperwork: Reche Hait, & Nichole Registered Nurse/Surgery scheduler: Kendelyn, RN & Katie, RN Certified Medical Assistants: Don, CMA, Elenor, CMA, Damien, CMA, & Auston, [place] Physician Assistants: Lyle Decamp, PA-C, Edsel Goods, PA-C & [name] Boys, PA-C Surgeons: Penne Sharps, MD & [name], MD     [facility] PREADMIT TESTING VISIT and SURGERY INFORMATION SHEET   Now that surgery has been scheduled you can anticipate several phone calls from [facility] services. A pharmacy technician will call you to verify your current list of medications taken at home.               The Pre-Service Center will call to verify your insurance information and to give you billing estimates and information.             The Preadmit Testing Office will be calling to schedule a visit to obtain information for the anesthesia team and provide instructions on preparation for surgery.  What can you expect for the Preadmit Testing Visit: Appointments may be scheduled in-person or by telephone.  If a telephone visit is scheduled, you may be asked to come into the office to have lab tests or other studies performed.   This visit will not be completed any greater than 14 days prior to your surgery.  If your surgery has been scheduled for a future date, please do  not be alarmed if we have not contacted you to schedule an appointment more than a month prior to the surgery date.    Please be prepared to provide the following information during this appointment:            -Personal medical history                                               -Medication and allergy list            -Any history of problems with anesthesia              -Recent lab work or diagnostic studies            -Please notify us  of any needs we should be aware of to provide the best care possible           -You will be provided with instructions on how to prepare for your surgery.    On The Day of Surgery:  You must have a driver to take you home after surgery, you will be asked not to drive for 24 hours following surgery.  Taxi, Gisele and non-medical transport will not be acceptable means of transportation unless you have a responsible individual who will be traveling with you.  Visitors in the surgical area:   2 people will be able to visit you in your room once your preparation for surgery has been completed. During surgery, your visitors will be asked to wait in the Surgery Waiting Area.  It is not a requirement for them to stay, if they prefer to leave and come back.  Your visitor(s) will be given an update once the surgery has been completed.  No visitors are allowed in the initial recovery room to respect patient privacy and safety.  Once you are more awake and transfer to the secondary recovery area, or are transferred to an inpatient room, visitors will again be able to see you.  To respect and protect your privacy: We will ask on the day of surgery who your driver will be and what the contact number for that individual will be. We will ask if it is okay to share information with this individual, or if there is an alternative individual that we, or the surgeon, should contact to provide updates and information. If family or friends come to the surgical information desk  requesting information about you, who you have not listed with us , no information will be given.   It may be helpful to designate someone as the main contact who will be responsible for updating your other friends and family.    PREADMIT TESTING OFFICE: [phone] SAME DAY SURGERY: [phone] We look forward to caring for you before and throughout the process of your surgery.

## 2024-02-14 ENCOUNTER — Encounter: Payer: Self-pay | Admitting: Radiology

## 2024-02-17 ENCOUNTER — Encounter
Admission: RE | Admit: 2024-02-17 | Discharge: 2024-02-17 | Disposition: A | Discharge status: Home / Self Care | Source: Ambulatory Visit | Attending: Neurosurgery | Admitting: Neurosurgery

## 2024-02-17 ENCOUNTER — Other Ambulatory Visit: Payer: Self-pay

## 2024-02-17 VITALS — BP 153/97 | HR 87 | Temp 98.2°F | Resp 18 | Ht 76.0 in | Wt 269.3 lb

## 2024-02-17 DIAGNOSIS — Z01810 Encounter for preprocedural cardiovascular examination: Secondary | ICD-10-CM | POA: Diagnosis not present

## 2024-02-17 DIAGNOSIS — Z01812 Encounter for preprocedural laboratory examination: Secondary | ICD-10-CM

## 2024-02-17 DIAGNOSIS — I10 Essential (primary) hypertension: Secondary | ICD-10-CM | POA: Diagnosis not present

## 2024-02-17 DIAGNOSIS — Z01818 Encounter for other preprocedural examination: Secondary | ICD-10-CM | POA: Insufficient documentation

## 2024-02-17 DIAGNOSIS — R9431 Abnormal electrocardiogram [ECG] [EKG]: Secondary | ICD-10-CM | POA: Diagnosis not present

## 2024-02-17 HISTORY — DX: Rheumatoid arthritis, unspecified: M06.9

## 2024-02-17 HISTORY — DX: Personal history of other mental and behavioral disorders: Z86.59

## 2024-02-17 HISTORY — DX: Personal history of other diseases of the digestive system: Z87.19

## 2024-02-17 HISTORY — DX: Neuralgia and neuritis, unspecified: M79.2

## 2024-02-17 HISTORY — DX: Deficiency of other specified B group vitamins: E53.8

## 2024-02-17 LAB — BASIC METABOLIC PANEL WITH GFR
Anion gap: 13 (ref 5–15)
BUN: 9 mg/dL (ref 6–20)
CO2: 23 mmol/L (ref 22–32)
Calcium: 9 mg/dL (ref 8.9–10.3)
Chloride: 101 mmol/L (ref 98–111)
Creatinine, Ser: 1.44 mg/dL — ABNORMAL HIGH (ref 0.61–1.24)
GFR, Estimated: 57 mL/min — ABNORMAL LOW (ref >60)
Glucose, Bld: 99 mg/dL (ref 70–99)
Potassium: 4 mmol/L (ref 3.5–5.1)
Sodium: 137 mmol/L (ref 135–145)

## 2024-02-17 LAB — CBC
HCT: 47.2 % (ref 39.0–52.0)
Hemoglobin: 15.9 g/dL (ref 13.0–17.0)
MCH: 30.8 pg (ref 26.0–34.0)
MCHC: 33.7 g/dL (ref 30.0–36.0)
MCV: 91.3 fL (ref 80.0–100.0)
Platelets: 240 K/uL (ref 150–400)
RBC: 5.17 MIL/uL (ref 4.22–5.81)
RDW: 13.6 % (ref 11.5–15.5)
WBC: 5.3 K/uL (ref 4.0–10.5)
nRBC: 0 % (ref 0.0–0.2)

## 2024-02-17 LAB — URINALYSIS, ROUTINE W REFLEX MICROSCOPIC
Bacteria, UA: NONE SEEN
Bilirubin Urine: NEGATIVE
Glucose, UA: >=500 mg/dL — AB
Hgb urine dipstick: NEGATIVE
Ketones, ur: NEGATIVE mg/dL
Leukocytes,Ua: NEGATIVE
Nitrite: NEGATIVE
Protein, ur: NEGATIVE mg/dL
Specific Gravity, Urine: 1.024 (ref 1.005–1.030)
pH: 5 (ref 5.0–8.0)

## 2024-02-17 LAB — SURGICAL PCR SCREEN
MRSA, PCR: NEGATIVE
Staphylococcus aureus: NEGATIVE

## 2024-02-17 LAB — TYPE AND SCREEN
ABO/RH(D): O POS
Antibody Screen: NEGATIVE

## 2024-02-17 NOTE — Patient Instructions (Signed)
 Your procedure is scheduled on: Thursday 03/02/24  Report to the Registration Desk on the 1st floor of the Medical Mall. To find out your arrival time, please call [phone] between 1PM - 3PM on: Friday 03/01/24  If your arrival time is 6:00 am, do not arrive before that time as the Medical Mall entrance doors do not open until 6:00 am.  REMEMBER: Instructions that are not followed completely may result in serious medical risk, up to and including death; or upon the discretion of your surgeon and anesthesiologist your surgery may need to be rescheduled.  Do not eat food after midnight the night before surgery.  No gum chewing or hard candies.  You may however, drink CLEAR liquids up to 2 hours before you are scheduled to arrive for your surgery. Do not drink anything within 2 hours of your scheduled arrival time.  Clear liquids include: - water  - apple juice without pulp - gatorade (not RED colors) - black coffee or tea (Do NOT add milk or creamers to the coffee or tea) Do NOT drink anything that is not on this list.   One week prior to surgery: Stop Anti-inflammatories (NSAIDS) such as Advil , Aleve, Ibuprofen , Motrin , Naproxen, Naprosyn diclofenac (VOLTAREN), and Aspirin based products such as Excedrin, Goody's Powder, BC Powder. Stop ANY OVER THE COUNTER supplements until after surgery. vitamin B-12 (CYANOCOBALAMIN )  cholecalciferol  (VITAMIN D ) 1000 units   You may however, continue to take Tylenol  if needed for pain up until the day of surgery.  Stop empagliflozin (JARDIANCE) 10 MG 3 days prior to surgery (take last dose Sunday 02/27/24)  Continue taking all of your other prescription medications up until the day of surgery.  ON THE DAY OF SURGERY ONLY TAKE THESE MEDICATIONS WITH SIPS OF WATER:  atenolol  (TENORMIN ) 25 MG  gabapentin  (NEURONTIN ) 800 MG  methocarbamol  (ROBAXIN ) 750 MG  omeprazole (PRILOSEC) 20 MG  PARoxetine  (PAXIL ) 20 MG    No Alcohol for 24 hours  before or after surgery.  No Smoking including e-cigarettes for 24 hours before surgery.  No chewable tobacco products for at least 6 hours before surgery.  No nicotine patches on the day of surgery.  Do not use any recreational drugs for at least a week (preferably 2 weeks) before your surgery.  Please be advised that the combination of cocaine and anesthesia may have negative outcomes, up to and including death. If you test positive for cocaine, your surgery will be cancelled.  On the morning of surgery brush your teeth with toothpaste and water, you may rinse your mouth with mouthwash if you wish. Do not swallow any toothpaste or mouthwash.  Use CHG Soap or wipes as directed on instruction sheet.  Do not wear jewelry, make-up, hairpins, clips or nail polish.  For welded (permanent) jewelry: bracelets, anklets, waist bands, etc.  Please have this removed prior to surgery.  If it is not removed, there is a chance that hospital personnel will need to cut it off on the day of surgery.  Do not wear lotions, powders, or perfumes.   Do not shave body hair from the neck down 48 hours before surgery.  Contact lenses, hearing aids and dentures may not be worn into surgery.  Do not bring valuables to the hospital. [facility] is not responsible for any missing/lost belongings or valuables.   Bring your C-PAP to the hospital in case you may have to spend the night.   Notify your doctor if there is any change in  your medical condition (cold, fever, infection).  Wear comfortable clothing (specific to your surgery type) to the hospital.  After surgery, you can help prevent lung complications by doing breathing exercises.  Take deep breaths and cough every 1-2 hours. Your doctor may order a device called an Incentive Spirometer to help you take deep breaths. When coughing or sneezing, hold a pillow firmly against your incision with both hands. This is called "splinting." Doing this helps  protect your incision. It also decreases belly discomfort.  If you are being admitted to the hospital overnight, leave your suitcase in the car. After surgery it may be brought to your room.  In case of increased patient census, it may be necessary for you, the patient, to continue your postoperative care in the Same Day Surgery department.  If you are being discharged the day of surgery, you will not be allowed to drive home. You will need a responsible individual to drive you home and stay with you for 24 hours after surgery.   If you are taking public transportation, you will need to have a responsible individual with you.  Please call the Pre-admissions Testing Dept. at [phone] if you have any questions about these instructions.  Surgery Visitation Policy:  Patients having surgery or a procedure may have two visitors.  Children under the age of 70 must have an adult with them who is not the patient.  Inpatient Visitation:    Visiting hours are 7 a.m. to 8 p.m. Up to four visitors are allowed at one time in a patient room. The visitors may rotate out with other people during the day.  One visitor age 79 or older may stay with the patient overnight and must be in the room by 8 p.m.   Merchandiser, Retail to address health-related social needs:  [email or web address]  Pre-operative 4 CHG Bath Instructions   You can play a key role in reducing the risk of infection after surgery. Your skin needs to be as free of germs as possible. You can reduce the number of germs on your skin by washing with CHG (chlorhexidine  gluconate) soap before surgery. CHG is an antiseptic soap that kills germs and continues to kill germs even after washing.   DO NOT use if you have an allergy to chlorhexidine /CHG or antibacterial soaps. If your skin becomes reddened or irritated, stop using the CHG and notify one of our RNs at [phone].   Please shower with the CHG soap starting  4 days before surgery using the following schedule:     Please keep in mind the following:  DO NOT shave, including legs and underarms, starting the day of your first shower.   You may shave your face at any point before/day of surgery.  Place clean sheets on your bed the day you start using CHG soap. Use a clean washcloth (not used since being washed) for each shower. DO NOT sleep with pets once you start using the CHG.   CHG Shower Instructions:  If you choose to wash your hair and private area, wash first with your normal shampoo/soap.  After you use shampoo/soap, rinse your hair and body thoroughly to remove shampoo/soap residue.  Turn the water OFF and apply about 3 tablespoons (45 ml) of CHG soap to a CLEAN washcloth.  Apply CHG soap ONLY FROM YOUR NECK DOWN TO YOUR TOES (washing for 3-5 minutes)  DO NOT use CHG soap on face, private areas, open wounds, or sores.  Pay special attention  to the area where your surgery is being performed.  If you are having back surgery, having someone wash your back for you may be helpful. Wait 2 minutes after CHG soap is applied, then you may rinse off the CHG soap.  Pat dry with a clean towel  Put on clean clothes/pajamas   If you choose to wear lotion, please use ONLY the CHG-compatible lotions on the back of this paper.     Additional instructions for the day of surgery: DO NOT APPLY any lotions, deodorants, cologne, or perfumes.   Put on clean/comfortable clothes.  Brush your teeth.  Ask your nurse before applying any prescription medications to the skin.      CHG Compatible Lotions   Aveeno Moisturizing lotion  Cetaphil Moisturizing Cream  Cetaphil Moisturizing Lotion  Clairol Herbal Essence Moisturizing Lotion, Dry Skin  Clairol Herbal Essence Moisturizing Lotion, Extra Dry Skin  Clairol Herbal Essence Moisturizing Lotion, Normal Skin  Curel Age Defying Therapeutic Moisturizing Lotion with Alpha Hydroxy  Curel Extreme Care Body  Lotion  Curel Soothing Hands Moisturizing Hand Lotion  Curel Therapeutic Moisturizing Cream, Fragrance-Free  Curel Therapeutic Moisturizing Lotion, Fragrance-Free  Curel Therapeutic Moisturizing Lotion, Original Formula  Eucerin Daily Replenishing Lotion  Eucerin Dry Skin Therapy Plus Alpha Hydroxy Crme  Eucerin Dry Skin Therapy Plus Alpha Hydroxy Lotion  Eucerin Original Crme  Eucerin Original Lotion  Eucerin Plus Crme Eucerin Plus Lotion  Eucerin TriLipid Replenishing Lotion  Keri Anti-Bacterial Hand Lotion  Keri Deep Conditioning Original Lotion Dry Skin Formula Softly Scented  Keri Deep Conditioning Original Lotion, Fragrance Free Sensitive Skin Formula  Keri Lotion Fast Absorbing Fragrance Free Sensitive Skin Formula  Keri Lotion Fast Absorbing Softly Scented Dry Skin Formula  Keri Original Lotion  Keri Skin Renewal Lotion Keri Silky Smooth Lotion  Keri Silky Smooth Sensitive Skin Lotion  Nivea Body Creamy Conditioning Oil  Nivea Body Extra Enriched Lotion  Nivea Body Original Lotion  Nivea Body Sheer Moisturizing Lotion Nivea Crme  Nivea Skin Firming Lotion  NutraDerm 30 Skin Lotion  NutraDerm Skin Lotion  NutraDerm Therapeutic Skin Cream  NutraDerm Therapeutic Skin Lotion  ProShield Protective Hand Cream  Provon moisturizing lotion  How to Use an Incentive Spirometer  An incentive spirometer is a tool that measures how well you are filling your lungs with each breath. Learning to take Hazzard, deep breaths using this tool can help you keep your lungs clear and active. This may help to reverse or lessen your chance of developing breathing (pulmonary) problems, especially infection. You may be asked to use a spirometer: After a surgery. If you have a lung problem or a history of smoking. After a Margulies period of time when you have been unable to move or be active. If the spirometer includes an indicator to show the highest number that you have reached, your health care  provider or respiratory therapist will help you set a goal. Keep a log of your progress as told by your health care provider. What are the risks? Breathing too quickly may cause dizziness or cause you to pass out. Take your time so you do not get dizzy or light-headed. If you are in pain, you may need to take pain medicine before doing incentive spirometry. It is harder to take a deep breath if you are having pain. How to use your incentive spirometer  Sit up on the edge of your bed or on a chair. Hold the incentive spirometer so that it is in an upright position.  Before you use the spirometer, breathe out normally. Place the mouthpiece in your mouth. Make sure your lips are closed tightly around it. Breathe in slowly and as deeply as you can through your mouth, causing the piston or the ball to rise toward the top of the chamber. Hold your breath for 3-5 seconds, or for as Hemphill as possible. If the spirometer includes a coach indicator, use this to guide you in breathing. Slow down your breathing if the indicator goes above the marked areas. Remove the mouthpiece from your mouth and breathe out normally. The piston or ball will return to the bottom of the chamber. Rest for a few seconds, then repeat the steps 10 or more times. Take your time and take a few normal breaths between deep breaths so that you do not get dizzy or light-headed. Do this every 1-2 hours when you are awake. If the spirometer includes a goal marker to show the highest number you have reached (best effort), use this as a goal to work toward during each repetition. After each set of 10 deep breaths, cough a few times. This will help to make sure that your lungs are clear. If you have an incision on your chest or abdomen from surgery, place a pillow or a rolled-up towel firmly against the incision when you cough. This can help to reduce pain while taking deep breaths and coughing. General tips When you are able to get out of  bed: Walk around often. Continue to take deep breaths and cough in order to clear your lungs. Keep using the incentive spirometer until your health care provider says it is okay to stop using it. If you have been in the hospital, you may be told to keep using the spirometer at home. Contact a health care provider if: You are having difficulty using the spirometer. You have trouble using the spirometer as often as instructed. Your pain medicine is not giving enough relief for you to use the spirometer as told. You have a fever. Get help right away if: You develop shortness of breath. You develop a cough with bloody mucus from the lungs. You have fluid or blood coming from an incision site after you cough. Summary An incentive spirometer is a tool that can help you learn to take Mallette, deep breaths to keep your lungs clear and active. You may be asked to use a spirometer after a surgery, if you have a lung problem or a history of smoking, or if you have been inactive for a Levings period of time. Use your incentive spirometer as instructed every 1-2 hours while you are awake. If you have an incision on your chest or abdomen, place a pillow or a rolled-up towel firmly against your incision when you cough. This will help to reduce pain. Get help right away if you have shortness of breath, you cough up bloody mucus, or blood comes from your incision when you cough. This information is not intended to replace advice given to you by your health care provider. Make sure you discuss any questions you have with your health care provider. Document Revised: 06/19/2019 Document Reviewed: 06/19/2019 Elsevier Patient Education  2023 [employer].

## 2024-02-18 NOTE — Telephone Encounter (Signed)
 LVM for Robert Maldonado regarding surgery instructions. Patient has not yet read mychart message containing instructions. Asked if they had access to this message or if they would like it mailed.

## 2024-03-01 MED ORDER — CHLORHEXIDINE GLUCONATE 0.12 % MT SOLN
15.0000 mL | Freq: Once | OROMUCOSAL | Status: AC
  Administered 2024-03-02: 15 mL via OROMUCOSAL

## 2024-03-01 MED ORDER — LACTATED RINGERS IV SOLN: INTRAVENOUS | Status: DC

## 2024-03-01 MED ORDER — ORAL CARE MOUTH RINSE: 15.0000 mL | Freq: Once | OROMUCOSAL | Status: AC

## 2024-03-02 ENCOUNTER — Ambulatory Visit: Admitting: Certified Registered"

## 2024-03-02 ENCOUNTER — Encounter: Payer: Self-pay | Admitting: Neurosurgery

## 2024-03-02 ENCOUNTER — Ambulatory Visit

## 2024-03-02 ENCOUNTER — Inpatient Hospital Stay
Admission: RE | Admit: 2024-03-02 | Discharge: 2024-03-10 | DRG: 520 | Disposition: A | Discharge status: Home Health | Source: Ambulatory Visit | Attending: Neurosurgery | Admitting: Neurosurgery

## 2024-03-02 ENCOUNTER — Other Ambulatory Visit: Payer: Self-pay

## 2024-03-02 ENCOUNTER — Encounter
Admission: RE | Disposition: A | Discharge status: Home Health | Payer: Self-pay | Source: Ambulatory Visit | Attending: Neurosurgery

## 2024-03-02 DIAGNOSIS — M21372 Foot drop, left foot: Secondary | ICD-10-CM | POA: Diagnosis present

## 2024-03-02 DIAGNOSIS — Z6832 Body mass index (BMI) 32.0-32.9, adult: Secondary | ICD-10-CM

## 2024-03-02 DIAGNOSIS — M4716 Other spondylosis with myelopathy, lumbar region: Principal | ICD-10-CM | POA: Diagnosis present

## 2024-03-02 DIAGNOSIS — R29898 Other symptoms and signs involving the musculoskeletal system: Secondary | ICD-10-CM | POA: Insufficient documentation

## 2024-03-02 DIAGNOSIS — M4726 Other spondylosis with radiculopathy, lumbar region: Secondary | ICD-10-CM | POA: Diagnosis present

## 2024-03-02 DIAGNOSIS — I951 Orthostatic hypotension: Secondary | ICD-10-CM | POA: Diagnosis present

## 2024-03-02 DIAGNOSIS — M48061 Spinal stenosis, lumbar region without neurogenic claudication: Secondary | ICD-10-CM | POA: Diagnosis present

## 2024-03-02 DIAGNOSIS — M4316 Spondylolisthesis, lumbar region: Secondary | ICD-10-CM | POA: Diagnosis present

## 2024-03-02 DIAGNOSIS — G629 Polyneuropathy, unspecified: Secondary | ICD-10-CM | POA: Diagnosis present

## 2024-03-02 DIAGNOSIS — M069 Rheumatoid arthritis, unspecified: Secondary | ICD-10-CM | POA: Diagnosis present

## 2024-03-02 DIAGNOSIS — E669 Obesity, unspecified: Secondary | ICD-10-CM | POA: Diagnosis present

## 2024-03-02 DIAGNOSIS — Z7984 Long term (current) use of oral hypoglycemic drugs: Secondary | ICD-10-CM

## 2024-03-02 DIAGNOSIS — I129 Hypertensive chronic kidney disease with stage 1 through stage 4 chronic kidney disease, or unspecified chronic kidney disease: Secondary | ICD-10-CM | POA: Diagnosis present

## 2024-03-02 DIAGNOSIS — Z833 Family history of diabetes mellitus: Secondary | ICD-10-CM

## 2024-03-02 DIAGNOSIS — T443X5A Adverse effect of other parasympatholytics [anticholinergics and antimuscarinics] and spasmolytics, initial encounter: Secondary | ICD-10-CM | POA: Diagnosis not present

## 2024-03-02 DIAGNOSIS — Z01818 Encounter for other preprocedural examination: Secondary | ICD-10-CM

## 2024-03-02 DIAGNOSIS — M48062 Spinal stenosis, lumbar region with neurogenic claudication: Secondary | ICD-10-CM | POA: Diagnosis not present

## 2024-03-02 DIAGNOSIS — R42 Dizziness and giddiness: Secondary | ICD-10-CM | POA: Diagnosis not present

## 2024-03-02 DIAGNOSIS — N1831 Chronic kidney disease, stage 3a: Secondary | ICD-10-CM | POA: Diagnosis present

## 2024-03-02 DIAGNOSIS — R531 Weakness: Secondary | ICD-10-CM

## 2024-03-02 DIAGNOSIS — R7303 Prediabetes: Secondary | ICD-10-CM | POA: Diagnosis present

## 2024-03-02 DIAGNOSIS — F32A Depression, unspecified: Secondary | ICD-10-CM | POA: Diagnosis present

## 2024-03-02 DIAGNOSIS — Z9889 Other specified postprocedural states: Principal | ICD-10-CM

## 2024-03-02 HISTORY — PX: LUMBAR LAMINECTOMY/DECOMPRESSION MICRODISCECTOMY: SHX5026

## 2024-03-02 LAB — GLUCOSE, CAPILLARY
Glucose-Capillary: 150 mg/dL — ABNORMAL HIGH (ref 70–99)
Glucose-Capillary: 145 mg/dL — ABNORMAL HIGH (ref 70–99)
Glucose-Capillary: 143 mg/dL — ABNORMAL HIGH (ref 70–99)

## 2024-03-02 LAB — HEMOGLOBIN A1C
Hgb A1c MFr Bld: 5.7 % — ABNORMAL HIGH (ref 4.8–5.6)
Mean Plasma Glucose: 116.89 mg/dL

## 2024-03-02 SURGERY — LUMBAR LAMINECTOMY/DECOMPRESSION MICRODISCECTOMY 3 LEVELS
Anesthesia: General

## 2024-03-02 MED ORDER — MIDAZOLAM HCL (PF) 2 MG/2ML IJ SOLN
INTRAMUSCULAR | Status: DC | PRN
  Administered 2024-03-02: 2 mg via INTRAVENOUS

## 2024-03-02 MED ORDER — SODIUM CHLORIDE 0.9 % IV SOLN: 250.0000 mL | INTRAVENOUS | Status: DC

## 2024-03-02 MED ORDER — GLYCOPYRROLATE 0.2 MG/ML IJ SOLN
INTRAMUSCULAR | Status: AC
Start: 2024-03-02 — End: 2024-03-02
  Filled 2024-03-02: qty 1

## 2024-03-02 MED ORDER — PROPOFOL 10 MG/ML IV BOLUS
INTRAVENOUS | Status: AC
Start: 2024-03-02 — End: 2024-03-02
  Filled 2024-03-02: qty 20

## 2024-03-02 MED ORDER — SUGAMMADEX SODIUM 200 MG/2ML IV SOLN
INTRAVENOUS | Status: DC | PRN
  Administered 2024-03-02: 300 mg via INTRAVENOUS

## 2024-03-02 MED ORDER — KETAMINE HCL 50 MG/5ML IJ SOSY
PREFILLED_SYRINGE | INTRAMUSCULAR | Status: DC | PRN
  Administered 2024-03-02: 20 mg via INTRAVENOUS
  Administered 2024-03-02: 30 mg via INTRAVENOUS

## 2024-03-02 MED ORDER — BISACODYL 5 MG PO TBEC: 5.0000 mg | DELAYED_RELEASE_TABLET | Freq: Every day | ORAL | Status: DC | PRN

## 2024-03-02 MED ORDER — DOCUSATE SODIUM 100 MG PO CAPS
100.0000 mg | ORAL_CAPSULE | Freq: Two times a day (BID) | ORAL | Status: DC
  Administered 2024-03-02 – 2024-03-10 (×16): 100 mg via ORAL
  Filled 2024-03-02 (×16): qty 1

## 2024-03-02 MED ORDER — MENTHOL 3 MG MT LOZG: 1.0000 | LOZENGE | OROMUCOSAL | Status: DC | PRN

## 2024-03-02 MED ORDER — SODIUM CHLORIDE 0.9% FLUSH
3.0000 mL | INTRAVENOUS | Status: DC | PRN
  Administered 2024-03-02: 3 mL via INTRAVENOUS

## 2024-03-02 MED ORDER — SENNA 8.6 MG PO TABS
1.0000 | ORAL_TABLET | Freq: Two times a day (BID) | ORAL | Status: DC
  Administered 2024-03-02 – 2024-03-10 (×16): 8.6 mg via ORAL
  Filled 2024-03-02 (×16): qty 1

## 2024-03-02 MED ORDER — METHOCARBAMOL 750 MG PO TABS
ORAL_TABLET | ORAL | Status: AC
  Filled 2024-03-02: qty 1

## 2024-03-02 MED ORDER — PANTOPRAZOLE SODIUM 40 MG PO TBEC
40.0000 mg | DELAYED_RELEASE_TABLET | Freq: Every day | ORAL | Status: DC
  Administered 2024-03-03 – 2024-03-10 (×8): 40 mg via ORAL
  Filled 2024-03-02 (×7): qty 1

## 2024-03-02 MED ORDER — ACETAMINOPHEN 10 MG/ML IV SOLN
INTRAVENOUS | Status: DC | PRN
Start: 2024-03-02 — End: 2024-03-02
  Administered 2024-03-02: 1000 mg via INTRAVENOUS

## 2024-03-02 MED ORDER — ONDANSETRON HCL 4 MG PO TABS: 4.0000 mg | ORAL_TABLET | Freq: Four times a day (QID) | ORAL | Status: DC | PRN

## 2024-03-02 MED ORDER — ENOXAPARIN SODIUM 40 MG/0.4ML IJ SOSY
40.0000 mg | PREFILLED_SYRINGE | INTRAMUSCULAR | Status: DC
  Administered 2024-03-03 – 2024-03-10 (×8): 40 mg via SUBCUTANEOUS
  Filled 2024-03-02 (×8): qty 0.4

## 2024-03-02 MED ORDER — PHENYLEPHRINE 80 MCG/ML (10ML) SYRINGE FOR IV PUSH (FOR BLOOD PRESSURE SUPPORT)
PREFILLED_SYRINGE | INTRAVENOUS | Status: DC | PRN
  Administered 2024-03-02 (×4): 80 ug via INTRAVENOUS
  Administered 2024-03-02: 160 ug via INTRAVENOUS

## 2024-03-02 MED ORDER — VITAMIN B-12 1000 MCG PO TABS
1000.0000 ug | ORAL_TABLET | Freq: Two times a day (BID) | ORAL | Status: DC
  Administered 2024-03-02 – 2024-03-10 (×15): 1000 ug via ORAL
  Filled 2024-03-02 (×15): qty 1

## 2024-03-02 MED ORDER — EPHEDRINE SULFATE-NACL 50-0.9 MG/10ML-% IV SOSY
PREFILLED_SYRINGE | INTRAVENOUS | Status: DC | PRN
  Administered 2024-03-02: 10 mg via INTRAVENOUS
  Administered 2024-03-02 (×2): 5 mg via INTRAVENOUS

## 2024-03-02 MED ORDER — PROPOFOL 10 MG/ML IV BOLUS
INTRAVENOUS | Status: DC | PRN
  Administered 2024-03-02: 200 mg via INTRAVENOUS

## 2024-03-02 MED ORDER — INSULIN ASPART 100 UNIT/ML IJ SOLN
0.0000 [IU] | Freq: Three times a day (TID) | INTRAMUSCULAR | Status: DC
  Administered 2024-03-02: 2 [IU] via SUBCUTANEOUS
  Administered 2024-03-03: 3 [IU] via SUBCUTANEOUS
  Administered 2024-03-04: 2 [IU] via SUBCUTANEOUS
  Filled 2024-03-02: qty 3
  Filled 2024-03-02: qty 1
  Filled 2024-03-02 (×2): qty 2

## 2024-03-02 MED ORDER — TRAMADOL HCL 50 MG PO TABS
50.0000 mg | ORAL_TABLET | Freq: Four times a day (QID) | ORAL | Status: DC
  Administered 2024-03-02 – 2024-03-03 (×4): 100 mg via ORAL
  Filled 2024-03-02 (×3): qty 2

## 2024-03-02 MED ORDER — AMITRIPTYLINE HCL 25 MG PO TABS
50.0000 mg | ORAL_TABLET | Freq: Every day | ORAL | Status: DC
  Administered 2024-03-02 – 2024-03-09 (×8): 50 mg via ORAL
  Filled 2024-03-02 (×8): qty 2

## 2024-03-02 MED ORDER — GABAPENTIN 400 MG PO CAPS
1600.0000 mg | ORAL_CAPSULE | Freq: Three times a day (TID) | ORAL | Status: DC
  Administered 2024-03-02 – 2024-03-10 (×24): 1600 mg via ORAL
  Filled 2024-03-02 (×22): qty 4

## 2024-03-02 MED ORDER — MIDAZOLAM HCL 2 MG/2ML IJ SOLN
INTRAMUSCULAR | Status: AC
  Filled 2024-03-02: qty 2

## 2024-03-02 MED ORDER — HYDROMORPHONE HCL 1 MG/ML IJ SOLN
1.0000 mg | INTRAMUSCULAR | Status: DC | PRN
  Filled 2024-03-02: qty 1

## 2024-03-02 MED ORDER — HYDROCODONE-ACETAMINOPHEN 7.5-325 MG PO TABS: 1.0000 | ORAL_TABLET | Freq: Once | ORAL | Status: DC | PRN

## 2024-03-02 MED ORDER — MAGNESIUM CITRATE PO SOLN: 1.0000 | Freq: Once | ORAL | Status: DC | PRN

## 2024-03-02 MED ORDER — PROPOFOL 10 MG/ML IV BOLUS
INTRAVENOUS | Status: AC
  Filled 2024-03-02: qty 20

## 2024-03-02 MED ORDER — OXYCODONE HCL 5 MG PO TABS: 5.0000 mg | ORAL_TABLET | ORAL | Status: DC | PRN

## 2024-03-02 MED ORDER — BUPIVACAINE-EPINEPHRINE (PF) 0.5% -1:200000 IJ SOLN
INTRAMUSCULAR | Status: DC | PRN
  Administered 2024-03-02: 10 mL

## 2024-03-02 MED ORDER — ACETAMINOPHEN 10 MG/ML IV SOLN
INTRAVENOUS | Status: AC
  Filled 2024-03-02: qty 100

## 2024-03-02 MED ORDER — ATENOLOL 25 MG PO TABS
25.0000 mg | ORAL_TABLET | Freq: Every day | ORAL | Status: DC
  Administered 2024-03-03 – 2024-03-05 (×3): 25 mg via ORAL
  Filled 2024-03-02 (×2): qty 1

## 2024-03-02 MED ORDER — DEXAMETHASONE SOD PHOSPHATE PF 10 MG/ML IJ SOLN
INTRAMUSCULAR | Status: DC | PRN
  Administered 2024-03-02: 10 mg via INTRAVENOUS

## 2024-03-02 MED ORDER — OXYCODONE HCL 5 MG PO TABS: 10.0000 mg | ORAL_TABLET | ORAL | Status: DC | PRN

## 2024-03-02 MED ORDER — CHLORHEXIDINE GLUCONATE 0.12 % MT SOLN
OROMUCOSAL | Status: AC
  Filled 2024-03-02: qty 15

## 2024-03-02 MED ORDER — 0.9 % SODIUM CHLORIDE (POUR BTL) OPTIME
TOPICAL | Status: DC | PRN
  Administered 2024-03-02: 500 mL

## 2024-03-02 MED ORDER — OXYCODONE HCL 5 MG PO TABS
ORAL_TABLET | ORAL | Status: AC
  Filled 2024-03-02: qty 1

## 2024-03-02 MED ORDER — FENTANYL CITRATE (PF) 100 MCG/2ML IJ SOLN
INTRAMUSCULAR | Status: AC
  Filled 2024-03-02: qty 2

## 2024-03-02 MED ORDER — ACETAMINOPHEN 650 MG RE SUPP: 650.0000 mg | RECTAL | Status: DC | PRN

## 2024-03-02 MED ORDER — ACETAMINOPHEN 325 MG PO TABS
650.0000 mg | ORAL_TABLET | ORAL | Status: DC | PRN
  Administered 2024-03-04: 650 mg via ORAL
  Filled 2024-03-02 (×2): qty 2

## 2024-03-02 MED ORDER — CLOTRIMAZOLE 1 % EX CREA
TOPICAL_CREAM | Freq: Two times a day (BID) | CUTANEOUS | Status: DC
  Filled 2024-03-02 (×2): qty 15

## 2024-03-02 MED ORDER — ACETAMINOPHEN 500 MG PO TABS
1000.0000 mg | ORAL_TABLET | Freq: Four times a day (QID) | ORAL | Status: AC
  Administered 2024-03-02 – 2024-03-03 (×4): 1000 mg via ORAL
  Filled 2024-03-02 (×2): qty 2

## 2024-03-02 MED ORDER — LIDOCAINE HCL (PF) 2 % IJ SOLN
INTRAMUSCULAR | Status: AC
  Filled 2024-03-02: qty 20

## 2024-03-02 MED ORDER — SENNOSIDES-DOCUSATE SODIUM 8.6-50 MG PO TABS
1.0000 | ORAL_TABLET | Freq: Every evening | ORAL | Status: DC | PRN
Start: 2024-03-02 — End: 2024-03-06

## 2024-03-02 MED ORDER — ONDANSETRON HCL 4 MG/2ML IJ SOLN
INTRAMUSCULAR | Status: AC
  Filled 2024-03-02: qty 6

## 2024-03-02 MED ORDER — PAROXETINE HCL 20 MG PO TABS
20.0000 mg | ORAL_TABLET | Freq: Every day | ORAL | Status: DC
  Administered 2024-03-04 – 2024-03-10 (×7): 20 mg via ORAL
  Filled 2024-03-02 (×7): qty 1

## 2024-03-02 MED ORDER — PHENYLEPHRINE HCL-NACL 20-0.9 MG/250ML-% IV SOLN
INTRAVENOUS | Status: AC
  Filled 2024-03-02: qty 250

## 2024-03-02 MED ORDER — FENTANYL CITRATE (PF) 100 MCG/2ML IJ SOLN
INTRAMUSCULAR | Status: DC | PRN
  Administered 2024-03-02 (×3): 50 ug via INTRAVENOUS

## 2024-03-02 MED ORDER — SCOPOLAMINE 1 MG/3DAYS TD PT72
1.0000 | MEDICATED_PATCH | TRANSDERMAL | Status: DC
  Administered 2024-03-02 – 2024-03-05 (×2): 1 mg via TRANSDERMAL
  Filled 2024-03-02: qty 1

## 2024-03-02 MED ORDER — ONDANSETRON HCL 4 MG/2ML IJ SOLN
4.0000 mg | Freq: Four times a day (QID) | INTRAMUSCULAR | Status: DC | PRN
  Administered 2024-03-06: 4 mg via INTRAVENOUS
  Filled 2024-03-02: qty 2

## 2024-03-02 MED ORDER — SCOPOLAMINE 1 MG/3DAYS TD PT72
MEDICATED_PATCH | TRANSDERMAL | Status: AC
  Filled 2024-03-02: qty 1

## 2024-03-02 MED ORDER — CEFAZOLIN SODIUM-DEXTROSE 3-4 GM/150ML-% IV SOLN
3.0000 g | INTRAVENOUS | Status: AC
  Administered 2024-03-02: 3 g via INTRAVENOUS
  Filled 2024-03-02: qty 150

## 2024-03-02 MED ORDER — PHENYLEPHRINE HCL-NACL 20-0.9 MG/250ML-% IV SOLN
INTRAVENOUS | Status: DC | PRN
  Administered 2024-03-02: 25 ug/min via INTRAVENOUS

## 2024-03-02 MED ORDER — SODIUM CHLORIDE (PF) 0.9 % IJ SOLN
INTRAMUSCULAR | Status: DC | PRN
  Administered 2024-03-02: 30 mL via INTRAMUSCULAR

## 2024-03-02 MED ORDER — ROCURONIUM BROMIDE 10 MG/ML (PF) SYRINGE
PREFILLED_SYRINGE | INTRAVENOUS | Status: AC
  Filled 2024-03-02: qty 10

## 2024-03-02 MED ORDER — SODIUM CHLORIDE 0.9% FLUSH
3.0000 mL | Freq: Two times a day (BID) | INTRAVENOUS | Status: DC
  Administered 2024-03-02: 3 mL via INTRAVENOUS

## 2024-03-02 MED ORDER — CEFAZOLIN IN SODIUM CHLORIDE 3-0.9 GM/100ML-% IV SOLN
3.0000 g | Freq: Once | INTRAVENOUS | Status: DC
  Filled 2024-03-02: qty 100

## 2024-03-02 MED ORDER — DEXMEDETOMIDINE HCL IN NACL 80 MCG/20ML IV SOLN
INTRAVENOUS | Status: AC
Start: 2024-03-02 — End: 2024-03-02
  Filled 2024-03-02: qty 20

## 2024-03-02 MED ORDER — HYDROMORPHONE HCL 1 MG/ML IJ SOLN
INTRAMUSCULAR | Status: AC
  Filled 2024-03-02: qty 1

## 2024-03-02 MED ORDER — HYDROCODONE-ACETAMINOPHEN 5-325 MG PO TABS
1.0000 | ORAL_TABLET | ORAL | Status: DC | PRN
Start: 2024-03-02 — End: 2024-03-02

## 2024-03-02 MED ORDER — EMPAGLIFLOZIN 10 MG PO TABS
10.0000 mg | ORAL_TABLET | Freq: Every day | ORAL | Status: DC
  Administered 2024-03-03 – 2024-03-10 (×8): 10 mg via ORAL
  Filled 2024-03-02 (×8): qty 1

## 2024-03-02 MED ORDER — SURGIFLO WITH THROMBIN (HEMOSTATIC MATRIX KIT) OPTIME
TOPICAL | Status: DC | PRN
  Administered 2024-03-02: 1 via TOPICAL

## 2024-03-02 MED ORDER — LIDOCAINE HCL (CARDIAC) PF 100 MG/5ML IV SOSY
PREFILLED_SYRINGE | INTRAVENOUS | Status: DC | PRN
  Administered 2024-03-02: 60 mg via INTRAVENOUS

## 2024-03-02 MED ORDER — METHOCARBAMOL 500 MG PO TABS
750.0000 mg | ORAL_TABLET | Freq: Three times a day (TID) | ORAL | Status: DC | PRN
  Administered 2024-03-02 – 2024-03-08 (×15): 750 mg via ORAL
  Filled 2024-03-02 (×15): qty 2

## 2024-03-02 MED ORDER — ONDANSETRON HCL 4 MG/2ML IJ SOLN
INTRAMUSCULAR | Status: AC
Start: 2024-03-02 — End: 2024-03-02
  Filled 2024-03-02: qty 2

## 2024-03-02 MED ORDER — EPHEDRINE 5 MG/ML INJ
INTRAVENOUS | Status: AC
  Filled 2024-03-02: qty 10

## 2024-03-02 MED ORDER — HYDROMORPHONE HCL 1 MG/ML IJ SOLN
0.2500 mg | INTRAMUSCULAR | Status: DC | PRN
  Administered 2024-03-02 (×4): 0.5 mg via INTRAVENOUS

## 2024-03-02 MED ORDER — TRAZODONE HCL 50 MG PO TABS
50.0000 mg | ORAL_TABLET | Freq: Every evening | ORAL | Status: DC | PRN
  Administered 2024-03-02: 50 mg via ORAL
  Administered 2024-03-03: 100 mg via ORAL
  Filled 2024-03-02 (×2): qty 2

## 2024-03-02 MED ORDER — GLYCOPYRROLATE 0.2 MG/ML IJ SOLN
INTRAMUSCULAR | Status: DC | PRN
  Administered 2024-03-02: .2 mg via INTRAVENOUS

## 2024-03-02 MED ORDER — KETAMINE HCL 50 MG/5ML IJ SOSY
PREFILLED_SYRINGE | INTRAMUSCULAR | Status: AC
  Filled 2024-03-02: qty 5

## 2024-03-02 MED ORDER — VITAMIN D 25 MCG (1000 UNIT) PO TABS
1000.0000 [IU] | ORAL_TABLET | Freq: Two times a day (BID) | ORAL | Status: DC
  Administered 2024-03-02 – 2024-03-10 (×16): 1000 [IU] via ORAL
  Filled 2024-03-02 (×16): qty 1

## 2024-03-02 MED ORDER — ROCURONIUM BROMIDE 100 MG/10ML IV SOLN
INTRAVENOUS | Status: DC | PRN
  Administered 2024-03-02 (×2): 20 mg via INTRAVENOUS
  Administered 2024-03-02: 60 mg via INTRAVENOUS

## 2024-03-02 MED ORDER — PHENOL 1.4 % MT LIQD
1.0000 | OROMUCOSAL | Status: DC | PRN
Start: 2024-03-02 — End: 2024-03-10

## 2024-03-02 MED ORDER — ONDANSETRON HCL 4 MG/2ML IJ SOLN
INTRAMUSCULAR | Status: DC | PRN
  Administered 2024-03-02: 4 mg via INTRAVENOUS

## 2024-03-02 SURGICAL SUPPLY — 34 items
BASIN KIT SINGLE STR (MISCELLANEOUS) ×1 IMPLANT
BRUSH SCRUB EZ 4% CHG (MISCELLANEOUS) ×1 IMPLANT
BUR NEURO DRILL SOFT 3.0X3.8M (BURR) ×1 IMPLANT
DERMABOND ADVANCED .7 DNX12 (GAUZE/BANDAGES/DRESSINGS) ×1 IMPLANT
DRAPE C-ARM XRAY 36X54 (DRAPES) ×2 IMPLANT
DRAPE LAPAROTOMY 100X77 ABD (DRAPES) ×1 IMPLANT
DRAPE SPINE LEICA/WILD 54X150 (DRAPES) ×1 IMPLANT
DRSG OPSITE POSTOP 3X4 (GAUZE/BANDAGES/DRESSINGS) ×1 IMPLANT
DRSG OPSITE POSTOP 4X8 (GAUZE/BANDAGES/DRESSINGS) IMPLANT
DRSG TEGADERM 4X4.75 (GAUZE/BANDAGES/DRESSINGS) IMPLANT
ELECTRODE EZSTD 165MM 6.5IN (MISCELLANEOUS) ×1 IMPLANT
ELECTRODE REM PT RTRN 9FT ADLT (ELECTROSURGICAL) ×1 IMPLANT
EVACUATOR 1/8 PVC DRAIN (DRAIN) IMPLANT
GLOVE BIOGEL PI IND STRL 7.0 (GLOVE) ×1 IMPLANT
GLOVE BIOGEL PI IND STRL 8 (GLOVE) ×2 IMPLANT
GLOVE SURG SYN 7.0 PF PI (GLOVE) ×1 IMPLANT
GLOVE SURG SYN 7.5 PF PI (GLOVE) ×1 IMPLANT
GOWN SRG XL LVL 3 NONREINFORCE (GOWNS) ×1 IMPLANT
GOWN STRL REUS W/ TWL LRG LVL3 (GOWN DISPOSABLE) ×1 IMPLANT
KIT WILSON FRAME (KITS) ×1 IMPLANT
NDL SAFETY ECLIP 18X1.5 (MISCELLANEOUS) ×1 IMPLANT
NS IRRIG 500ML POUR BTL (IV SOLUTION) ×1 IMPLANT
PACK LAMINECTOMY ARMC (PACKS) ×1 IMPLANT
PAD ARMBOARD POSITIONER FOAM (MISCELLANEOUS) ×2 IMPLANT
STAPLER SKIN PROX 35W (STAPLE) IMPLANT
SURGIFLO W/THROMBIN 8M KIT (HEMOSTASIS) ×1 IMPLANT
SUT MNCRL AB 4-0 PS2 18 (SUTURE) IMPLANT
SUT STRATA 3-0 15 PS-2 (SUTURE) ×1 IMPLANT
SUT VIC AB 0 CT1 27XCR 8 STRN (SUTURE) ×1 IMPLANT
SUT VIC AB 2-0 CT1 18 (SUTURE) ×1 IMPLANT
SYR 30ML LL (SYRINGE) ×2 IMPLANT
SYR 3ML LL SCALE MARK (SYRINGE) ×1 IMPLANT
TRAP FLUID SMOKE EVACUATOR (MISCELLANEOUS) ×1 IMPLANT
ULTRASOUND BK UROLOGY PROCEDUR (MISCELLANEOUS) IMPLANT

## 2024-03-02 NOTE — Transfer of Care (Signed)
 Immediate Anesthesia Transfer of Care Note  Patient: Robert Maldonado  Procedure(s) Performed: Lumbar laminectomy medial facetectomy and lateral recess decompression, L3-S1  Patient Location: PACU  Anesthesia Type:General  Level of Consciousness: drowsy and patient cooperative  Airway & Oxygen Therapy: Patient Spontanous Breathing and Patient connected to face mask oxygen  Post-op Assessment: Report given to RN, Post -op Vital signs reviewed and stable, and Patient moving all extremities X 4  Post vital signs: Reviewed and stable  Last Vitals:  Vitals Value Taken Time  BP    Temp    Pulse 74 03/02/24 13:43  Resp 15 03/02/24 13:43  SpO2 100 % 03/02/24 13:43  Vitals shown include unfiled device data.  Last Pain:  Vitals:   03/02/24 0945  TempSrc: Temporal  PainSc: 5          Complications: No notable events documented.

## 2024-03-02 NOTE — Progress Notes (Signed)
   REFERRING PHYSICIAN:  Claudene Penne ORN, Md [address] [place],  [place] [address]  DOS: 06/08/23, C5-7 ACDF  HISTORY OF PRESENT ILLNESS: Robert Maldonado previously underwent C5-7 ACDF approximately 6 months ago and comes in today for continued left leg weakness neurogenic claudication and pain.  He continues to have a left foot drop and pain that extends from his back to his left hip and down his leg.  He has done multiple sessions of physical therapy which she feels those helped some however his left foot drop has now been ongoing for 5-6 months.  He continues to have severe pain going on that leg as well.  Also has significant back pain.  PHYSICAL EXAMINATION:  NEUROLOGICAL:  General: In no acute distress.   Awake, alert, oriented to person, place, and time.  Pupils equal round and reactive to light.   Patient tender to palpation about his left shoulder.  Also pain with passive range of motion maneuvers indicative of potential rotator cuff pathology.  Strength: Weakness noted in left hip flexion (3) as well as left dorsiflexion (2) and plantarflexion (4-).  Worse in left dorsiflexion and EHL.  Pain with internal and external rotation of the hip.  Cervical incision c/d/I.  Well-healed.  Gait altered secondary to left foot drop.  Imaging:   IMPRESSION: 1. Multilevel lumbar spondylosis, epidural lipomatosis and facet arthropathy, worst at L3-4 where there is severe narrowing of the thecal sac and moderate right neural foraminal narrowing. 2. Moderate-to-severe narrowing of the thecal sac at L4-5 and L5-S1. 3. Moderate bilateral facet arthropathy at L5-S1 with periarticular edema, which can be a source of pain.     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  He continues to have pain in his left low back  that extends to his left hip and down his leg.  On physical examination he continues to have a significant left-sided foot drop.  He has not had any improvement with conservative management.  He has been dealing with this for approximately 5 to 6 months.  His MRI shows a pars defect with significant spinal stenosis as well as spondylolisthesis.  This is also causing severe stenosis.  His flexion-extension x-rays did not demonstrate any mobile spondylolisthesis.  Given these findings we will plan for a decompression alone understanding that he may progress and need a spinal fusion at some point in the future.  Most of his symptoms are concerning for radiculopathy and neurogenic claudication so we will plan on treating the compression first.  Will continue to follow him closely afterwards to see whether or not he will need any fixation type procedure.  He would like to go forward with the procedure.  Risk benefits were discussed.  Advised to contact the office if any questions or concerns arise.   [place] ORN Claudene MD Dept of Neurosurgery

## 2024-03-02 NOTE — Anesthesia Postprocedure Evaluation (Signed)
 Anesthesia Post Note  Patient: Robert Maldonado  Procedure(s) Performed: Lumbar laminectomy medial facetectomy and lateral recess decompression, L3-S1  Patient location during evaluation: PACU Anesthesia Type: General Level of consciousness: awake and alert Pain management: pain level controlled Vital Signs Assessment: post-procedure vital signs reviewed and stable Respiratory status: spontaneous breathing, nonlabored ventilation, respiratory function stable and patient connected to nasal cannula oxygen Cardiovascular status: blood pressure returned to baseline and stable Postop Assessment: no apparent nausea or vomiting Anesthetic complications: no   No notable events documented.   Last Vitals:  Vitals:   03/02/24 1430 03/02/24 1445  BP: (!) 132/100 128/82  Pulse: 73 71  Resp: 14 19  Temp:    SpO2: 97% 95%    Last Pain:  Vitals:   03/02/24 1445  TempSrc:   PainSc: 5                  Shau-Shau Melia

## 2024-03-02 NOTE — Anesthesia Procedure Notes (Signed)
 Procedure Name: Intubation Date/Time: 03/02/2024 10:46 AM  Performed by: Lennie Lamarr HERO, CRNAPre-anesthesia Checklist: Patient identified, Emergency Drugs available, Suction available and Patient being monitored Patient Re-evaluated:Patient Re-evaluated prior to induction Oxygen Delivery Method: Circle System Utilized Preoxygenation: Pre-oxygenation with 100% oxygen Induction Type: IV induction Ventilation: Mask ventilation without difficulty, Oral airway inserted - appropriate to patient size and Two handed mask ventilation required Laryngoscope Size: McGrath and 4 Grade View: Grade II Tube type: Oral Tube size: 7.5 mm Number of attempts: 1 Airway Equipment and Method: Stylet and Oral airway Placement Confirmation: ETT inserted through vocal cords under direct vision, positive ETCO2 and breath sounds checked- equal and bilateral Secured at: 23 cm Tube secured with: Tape Dental Injury: Teeth and Oropharynx as per pre-operative assessment

## 2024-03-02 NOTE — Anesthesia Preprocedure Evaluation (Signed)
 Anesthesia Evaluation  Patient identified by MRN, date of birth, ID band Patient awake    Reviewed: Allergy & Precautions, H&P , NPO status , Patient's Chart, lab work & pertinent test results, reviewed documented beta blocker date and time   History of Anesthesia Complications (+) PONV and history of anesthetic complications  Airway Mallampati: I  TM Distance: >3 FB Neck ROM: full    Dental  (+) Dental Advidsory Given, Teeth Intact, Poor Dentition   Pulmonary neg pulmonary ROS   Pulmonary exam normal        Cardiovascular Exercise Tolerance: Good hypertension, (-) angina (-) CAD, (-) Past MI, (-) Cardiac Stents and (-) CABG negative cardio ROS Normal cardiovascular exam(-) dysrhythmias (-) Valvular Problems/Murmurs     Neuro/Psych negative neurological ROS  negative psych ROS   GI/Hepatic negative GI ROS, Neg liver ROS,GERD  ,,  Endo/Other  negative endocrine ROS    Renal/GU negative Renal ROS  negative genitourinary   Musculoskeletal  (+) Arthritis ,    Abdominal   Peds  Hematology negative hematology ROS (+)   Anesthesia Other Findings Past Medical History: 09/27/2016: Cellulitis and abscess of foot 05/17/2023: Cervical disc disorder with radiculopathy of cervical  region 03/18/2023: Cervical myelopathy (HCC) 03/18/2023: Gait abnormality No date: Hypertension 05/19/2023: Left arm weakness No date: PONV (postoperative nausea and vomiting) No date: Pre-diabetes 02/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, cervical, with myelopathy  Past Surgical History: No date: BACK SURGERY 09/28/2016: IRRIGATION AND DEBRIDEMENT FOOT; Left     Comment:  Procedure: IRRIGATION AND DEBRIDEMENT FOOT;  Surgeon:               Lilli Cough, DPM;  Location: [facility] ORS;  Service:               Podiatry;  Laterality: Left; No date: LEG SURGERY; Left  BMI    Body Mass Index: 32.40 kg/m      Reproductive/Obstetrics negative OB ROS                               Anesthesia Physical Anesthesia Plan  ASA: 2  Anesthesia Plan: General ETT   Post-op Pain Management:    Induction: Intravenous  PONV Risk Score and Plan: 3 and Ondansetron   Airway Management Planned: Oral ETT  Additional Equipment:   Intra-op Plan:   Post-operative Plan: Extubation in OR  Informed Consent: I have reviewed the patients History and Physical, chart, labs and discussed the procedure including the risks, benefits and alternatives for the proposed anesthesia with the patient or authorized representative who has indicated his/her understanding and acceptance.     Dental Advisory Given  Plan Discussed with: Anesthesiologist, CRNA and Surgeon  Anesthesia Plan Comments: (Patient consented for risks of anesthesia including but not limited to:  - adverse reactions to medications - damage to eyes, teeth, lips or other oral mucosa - nerve damage due to positioning  - sore throat or hoarseness - Damage to heart, brain, nerves, lungs, other parts of body or loss of life  Patient voiced understanding and assent.)         Anesthesia Quick Evaluation

## 2024-03-02 NOTE — Anesthesia Postprocedure Evaluation (Signed)
 Anesthesia Post Note  Patient: Robert Maldonado  Procedure(s) Performed: Lumbar laminectomy medial facetectomy and lateral recess decompression, L3-S1  Patient location during evaluation: PACU Anesthesia Type: General Level of consciousness: awake and alert Pain management: pain level controlled Vital Signs Assessment: post-procedure vital signs reviewed and stable Respiratory status: spontaneous breathing, nonlabored ventilation, respiratory function stable and patient connected to nasal cannula oxygen Cardiovascular status: blood pressure returned to baseline and stable Postop Assessment: no apparent nausea or vomiting Anesthetic complications: no   No notable events documented.   Last Vitals:  Vitals:   03/02/24 0945 03/02/24 1340  BP: (!) 147/108 (!) 143/86  Pulse: 60 (!) 113  Resp: 18 12  Temp: (!) 36.1 C (!) 36.2 C  SpO2: 97% 99%    Last Pain:  Vitals:   03/02/24 0945  TempSrc: Temporal  PainSc: 5                  Shau-Shau Melia

## 2024-03-02 NOTE — Op Note (Signed)
 Indications: Severe lumbar stenosis with history of cervical myelopathy.  Progressive lower extremity weakness numbness tingling and intractable back pain and leg pain.  Findings: Severe compressive lumbar stenosis without evidence of frank instability, secondary to facet hypertrophy, ligamentum flavum hypertrophy, and severe nerve compression bilaterally.  Preoperative Diagnosis: Lumbar spondylosis with radiculopathy, claudication and foot drop Postoperative Diagnosis: Lumbar spondylosis with radiculopathy, claudication and foot drop   EBL: 300 mL IVF: see anesthesia record Drains: JP drain Disposition: Extubated and Stable to PACU Complications: none  No foley catheter was placed.  Procedure:  L3/S1 Laminectomy, medial facetectomy and lateral recess decomopression  Preoperative Note:   Risks of surgery discussed include: infection, bleeding, stroke, coma, death, paralysis, CSF leak, nerve/spinal cord injury, numbness, tingling, weakness, complex regional pain syndrome, recurrent stenosis and/or disc herniation, vascular injury, development of instability, neck/back pain, need for further surgery, persistent symptoms, development of deformity, and the risks of anesthesia. They understood these risks and have agreed to proceed.  Operative Note:    The patient was then brought from the preoperative center with intravenous access established.  The patient underwent general anesthesia and endotracheal tube intubation, then was rotated on the [facility] table where all pressure points were appropriately padded.  An incision was marked with flouroscopy. The skin was then thoroughly cleansed.  Perioperative antibiotic prophylaxis was administered.  Sterile prep and drapes were then applied and a timeout was then observed.    Once this was complete a 2 cm incision was opened with the use of a #10 blade knife.  The paraspinus muscles bilaterally were subperiosteally dissected until the facet was  visualized. Flouroscopy was used to confirm the level. A self-retaining retractor was placed.  The microscope was then sterilely brought into the field. Using a high speed drill, the  L L3/S1 laminectomy and medial facet were drilled until ligamentum flavum was visualized. After the laminoforaminotomy was performed, a curette was used to dissect the ligamentum flavum until the epidural fat and dura were visualized. The ligamentum was then carefully removed with 2mm Kerrison punch and rongeurs.   Once the underlying dura was visualized a Penfield 4 was then used to dissect and expose the traversing nerve root.  Once this was identified a nerve root retractor was used to mobilize this medially.  The venous plexus was hemostased with Surgifoam and light bipolar use.  The disc bulge was dissected and then opened with a #15 blade knife. The extruded disc fragment was then dissected free with microsurgical instruments and removed with a micropituitary rongeur.  We performed a midline laminectomy as well as lateral recess decompression at each level from L3-S1 resulting in significant decompression of the central and traversing nerve roots.  There is no evidence of any residual compression noted after the decompression was completed.  The nerves were under much less tension, there was no more kinking in the dura.  There is pulsatile flow noted at the cranial and caudal aspects of the incision.  No evidence of CSF leak at this time.    Subfascial drain was placed  The fascial layer was reapproximated with the use of a 0- Vicryl suture.  Subcutaneous tissue layer was reapproximated using 2-0 Vicryl suture.  The skin was then cleansed and Dermabond was used to close the skin opening.  Patient was then rotated back to the preoperative bed awakened from anesthesia and taken to recovery all counts are correct in this case.  I performed the entire procedure with the assistance of [employer] PA  as an horticulturist, commercial.  They helped with tissue retraction, neurologic structure protection, closure, I performed the critical aspects of the procedure myself.   Penne MICAEL Sharps, MD

## 2024-03-03 ENCOUNTER — Other Ambulatory Visit: Payer: Self-pay

## 2024-03-03 ENCOUNTER — Encounter: Payer: Self-pay | Admitting: Neurosurgery

## 2024-03-03 LAB — GLUCOSE, CAPILLARY
Glucose-Capillary: 154 mg/dL — ABNORMAL HIGH (ref 70–99)
Glucose-Capillary: 116 mg/dL — ABNORMAL HIGH (ref 70–99)
Glucose-Capillary: 106 mg/dL — ABNORMAL HIGH (ref 70–99)
Glucose-Capillary: 125 mg/dL — ABNORMAL HIGH (ref 70–99)

## 2024-03-03 MED ORDER — TRAMADOL HCL 50 MG PO TABS
100.0000 mg | ORAL_TABLET | Freq: Four times a day (QID) | ORAL | Status: DC | PRN
  Administered 2024-03-03 – 2024-03-04 (×2): 100 mg via ORAL
  Filled 2024-03-03 (×2): qty 2

## 2024-03-03 MED ORDER — GABAPENTIN 300 MG PO CAPS
ORAL_CAPSULE | ORAL | Status: AC
  Filled 2024-03-03: qty 4

## 2024-03-03 MED ORDER — MECLIZINE HCL 25 MG PO TABS
12.5000 mg | ORAL_TABLET | Freq: Three times a day (TID) | ORAL | Status: DC | PRN
  Administered 2024-03-04: 12.5 mg via ORAL
  Filled 2024-03-03 (×3): qty 0.5

## 2024-03-03 MED ORDER — PANTOPRAZOLE SODIUM 40 MG PO TBEC
DELAYED_RELEASE_TABLET | ORAL | Status: AC
  Filled 2024-03-03: qty 1

## 2024-03-03 MED ORDER — GABAPENTIN 300 MG PO CAPS
ORAL_CAPSULE | ORAL | Status: AC
  Filled 2024-03-03: qty 5

## 2024-03-03 MED ORDER — GABAPENTIN 100 MG PO CAPS
ORAL_CAPSULE | ORAL | Status: AC
  Filled 2024-03-03: qty 1

## 2024-03-03 MED ORDER — ACETAMINOPHEN 500 MG PO TABS
ORAL_TABLET | ORAL | Status: AC
Start: 2024-03-03 — End: 2024-03-03
  Filled 2024-03-03: qty 2

## 2024-03-03 MED ORDER — GABAPENTIN 100 MG PO CAPS
ORAL_CAPSULE | ORAL | Status: AC
Start: 2024-03-03 — End: 2024-03-03
  Filled 2024-03-03: qty 1

## 2024-03-03 MED ORDER — GABAPENTIN 300 MG PO CAPS
ORAL_CAPSULE | ORAL | Status: AC
  Filled 2024-03-03: qty 1

## 2024-03-03 MED ORDER — ATENOLOL 50 MG PO TABS
ORAL_TABLET | ORAL | Status: AC
  Filled 2024-03-03: qty 1

## 2024-03-03 MED ORDER — TRAMADOL HCL 50 MG PO TABS
ORAL_TABLET | ORAL | Status: AC
  Filled 2024-03-03: qty 2

## 2024-03-03 MED ORDER — ACETAMINOPHEN 500 MG PO TABS
1000.0000 mg | ORAL_TABLET | Freq: Three times a day (TID) | ORAL | 0 refills | Status: DC
  Filled 2024-03-03: qty 42, 7d supply, fill #0

## 2024-03-03 MED ORDER — TRAMADOL HCL 50 MG PO TABS
50.0000 mg | ORAL_TABLET | Freq: Four times a day (QID) | ORAL | 0 refills | Status: AC
  Filled 2024-03-03: qty 56, 7d supply, fill #0

## 2024-03-03 NOTE — TOC Initial Note (Signed)
 Transition of Care [facility]) - Initial/Assessment Note    Patient Details  Name: Robert Maldonado MRN: [id number] Date of Birth: 05/17/1965  Transition of Care [facility] CM/SW Contact:    Nathanael CHRISTELLA Ring, RN Phone Number: 03/03/2024, 1:49 PM  Clinical Narrative:                 Met with patient at the bedside.  He has been seen by PT and OT and both recommend home health.  He chooses [facility].  Shaun with [facility] notified.  Patient is from home with his wife, his daughter is at the bedside with him now.  He says that his daughter will transport him home.  Sounds like discharge will not be today due to some visual disturbances and dizziness.  He has all needed equipment at home.    Expected Discharge Plan: Home w Home Health Services Barriers to Discharge: Continued Medical Work up   Patient Goals and CMS Choice Patient states their goals for this hospitalization and ongoing recovery are:: For the dizziness to resolve and his vision improve and go home CMS Medicare.gov Compare Post Acute Care list provided to:: Patient Choice offered to / list presented to : Patient      Expected Discharge Plan and Services   Discharge Planning Services: CM Consult Post Acute Care Choice: Home Health Living arrangements for the past 2 months: Single Family Home                 DME Arranged: N/A         HH Arranged: OT, PT HH Agency: [facility]) Date HH Agency Contacted: 03/03/24 Time HH Agency Contacted: 1346 Representative spoke with at [facility] Agency: Shaun  Prior Living Arrangements/Services Living arrangements for the past 2 months: Single Family Home Lives with:: Spouse Patient language and need for interpreter reviewed:: Yes Do you feel safe going back to the place where you live?: Yes      Need for Family Participation in Patient Care: Yes (Comment) Care giver support system in place?: Yes (comment) Current home services: DME Frieda, wheelchair, [facility]) Criminal  Activity/Legal Involvement Pertinent to Current Situation/Hospitalization: No - Comment as needed  Activities of Daily Living   ADL Screening (condition at time of admission) Independently performs ADLs?: Yes (appropriate for developmental age) Is the patient deaf or have difficulty hearing?: No Does the patient have difficulty seeing, even when wearing glasses/contacts?: No Does the patient have difficulty concentrating, remembering, or making decisions?: No  Permission Sought/Granted Permission sought to share information with : Family Supports Permission granted to share information with : Yes, Verbal Permission Granted              Emotional Assessment Appearance:: Appears stated age Attitude/Demeanor/Rapport: Engaged Affect (typically observed): Accepting Orientation: : Oriented to Self, Oriented to Place, Oriented to  Time, Oriented to Situation Alcohol / Substance Use: Not Applicable Psych Involvement: No (comment)  Admission diagnosis:  Spondylosis, lumbar, with myelopathy [M47.16] Left leg weakness [R29.898] Spinal stenosis, lumbar region, with neurogenic claudication [M48.062] Lumbar stenosis [M48.061] Patient Active Problem List   Diagnosis Date Noted   Lumbar stenosis 03/02/2024   Spondylosis, lumbar, with myelopathy 01/25/2024   Left leg weakness 01/25/2024   Spinal stenosis, lumbar region, with neurogenic claudication 01/25/2024   S/P cervical spinal fusion 07/20/2023   Cervical radiculopathy 06/08/2023   Cervical disc disorder with radiculopathy of cervical region 05/17/2023   Left arm weakness 05/17/2023   Gait abnormality 03/18/2023   Weakness 03/18/2023  Cervical myelopathy (HCC) 03/18/2023   Spondylosis, cervical, with myelopathy 02/17/2023   Cellulitis and abscess of foot 09/27/2016   Cellulitis 09/26/2016   PCP:  Cleotilde Oneil FALCON, MD Pharmacy:   [facility] [address] [place] - [address] [place] [address] Phone:  [phone] Fax: [phone]  [facility] [address] [place] [address] Phone: [phone] Fax: [phone]     Social Drivers of Health (SDOH) Social History: SDOH Screenings   Food Insecurity: No Food Insecurity (03/02/2024)  Housing: Low Risk  (03/02/2024)  Transportation Needs: No Transportation Needs (03/02/2024)  Utilities: Not At Risk (03/02/2024)  Financial Resource Strain: Medium Risk (10/07/2023)   Received from [facility]  Tobacco Use: Low Risk  (03/02/2024)   SDOH Interventions:     Readmission Risk Interventions     No data to display

## 2024-03-03 NOTE — Progress Notes (Signed)
 Attending Progress Note  History: Robert Maldonado is here for C5-7 Anterior Cervical Discectomy And Fusion (forge), they are currently 1 Day Post-Op.   POD1: Doing well.  Midline back pain but feels like his legs feel better already.  Physical Exam: Vitals:   03/02/24 2328 03/03/24 0514  BP: 130/82 105/62  Pulse: 72 85  Resp: 16 18  Temp: 98 F (36.7 C)   SpO2: 93% 97%    AA Ox3 CNI  Strength: No new physical deficits on physical examination, continues to have slight foot drop.  Data:  No results for input(s): NA, K, CL, CO2, BUN, CREATININE, LABGLOM, GLUCOSE, CALCIUM  in the last 168 hours. No results for input(s): AST, ALT, ALKPHOS in the last 168 hours.  Invalid input(s): TBILI   No results for input(s): WBC, HGB, HCT, PLT in the last 168 hours. No results for input(s): APTT, INR in the last 168 hours.      DG Lumbar Spine 2-3 Views Result Date: 03/02/2024 CLINICAL DATA:  Elective surgery. EXAM: LUMBAR SPINE - 2-3 VIEW COMPARISON:  None Available. FINDINGS: Three fluoroscopic spot views of the lumbar spine submitted from the operating room. Surgical instruments localize posteriorly from L3 through S1. Fluoroscopy time 6 seconds. Dose 3.87 mGy. IMPRESSION: Intraoperative fluoroscopy during lumbar surgery. Electronically Signed   By: Andrea Gasman M.D.   On: 03/02/2024 16:36   DG C-Arm 1-60 Min-No Report Result Date: 03/02/2024 Fluoroscopy was utilized by the requesting physician.  No radiographic interpretation.   DG C-Arm 1-60 Min-No Report Result Date: 03/02/2024 Fluoroscopy was utilized by the requesting physician.  No radiographic interpretation.    Other tests/results: No new testing  Assessment/Plan:  Robert Maldonado postoperative day 1 from a lumbar laminectomy, L3-S1.  Doing well in recovery.  Plan for likely discharge today  - Drains: Removed - mobilize - pain control - DVT prophylaxis - PTOT, plans for discharge  after PT OT evaluation if appropriate  Robert MICAEL Sharps, MD/MSCR Department of Neurosurgery
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
## 2024-03-03 NOTE — Evaluation (Signed)
 Physical Therapy Evaluation Patient Details Name: Robert Maldonado MRN: [id number] DOB: 11-16-1965 Today's Date: 03/03/2024  History of Present Illness  Robert Maldonado is a 58yoM who presents to [facility] 03/02/24 for elective Left L3-S1 laminectomy in the setting of lumbar spinal stenosis, myelopathy, LLE pain, weakness, myoclonus. PMH: ACDF.  Clinical Impression  Pt in recliner on arrival, has been up with OT. Pt able to demonstrate STS transfer from recliner and higher surface, both require heavy effort due to weakness and pain. Pt AMB ~29ft before his dizziness warrants taking a seated recovery so vitals can be assess, pt showing some orthostatic pressures. Pt also reporting a cluster of other symptoms: visual floaters and trails, intermittent diplopia, spinning dizziness while up AMB and also later with testing of visual system. Cross cover test is negative, but pt reports blurriness of vision in right eye only. Pt endorses bilat tinnitus which is new since surgery. Pt is more dizzy during fixed gaze and smooth pursuits in oculomotor test, no spontaneous nystagmus is seen. Attemted VOR testing, unable to complete due to guarding and increased vertigo on testing. MD/RN made aware of cluster of findings, pt elects to remain at EOB for comfort, DTR at bedside. Author feels unsafe to attempt lengthier AMB bouts or stairs training at present. Will continue to follow.     03/03/24 1025  Therapy Vitals  Pulse Rate 67  Resp 16  BP 134/84  Orthostatic Sitting  BP- Sitting (!) 156/92  Orthostatic Standing at 0 minutes  BP- Standing at 0 minutes (!) 134/92  Pulse- Standing at 0 minutes 84  Orthostatic Standing at 3 minutes  BP- Standing at 3 minutes 118/87  Pulse- Standing at 3 minutes 81  Oxygen Therapy  SpO2 98 %  O2 Device Room Air          If plan is discharge home, recommend the following: A lot of help with walking and/or transfers;A little help with bathing/dressing/bathroom;Assist for  transportation;Assistance with cooking/housework;Help with stairs or ramp for entrance   Can travel by private vehicle        Equipment Recommendations None recommended by PT  Recommendations for Other Services       Functional Status Assessment Patient has had a recent decline in their functional status and demonstrates the ability to make significant improvements in function in a reasonable and predictable amount of time.     Precautions / Restrictions Precautions Precautions: Fall;Back Precaution/Restrictions Comments: no brace needed Restrictions Weight Bearing Restrictions Per Provider Order: No      Mobility  Bed Mobility                    Transfers Overall transfer level: Needs assistance Equipment used: Rolling walker (2 wheels) Transfers: Sit to/from Stand Sit to Stand: Contact guard assist           General transfer comment: heavy effort    Ambulation/Gait   Gait Distance (Feet): 10 Feet Assistive device: Rolling walker (2 wheels) Gait Pattern/deviations: Ataxic       General Gait Details: slow, pauses abnormaly in swng phase  Stairs            Wheelchair Mobility     Tilt Bed    Modified Rankin (Stroke Patients Only)       Balance  Pertinent Vitals/Pain Pain Assessment Pain Assessment: 0-10 Pain Score: 8  Pain Location: LBP with standing, no pain in sitting Pain Descriptors / Indicators: Discomfort    Home Living Family/patient expects to be discharged to:: Private residence Living Arrangements: Spouse/significant other;Children Available Help at Discharge: Family Type of Home: House Home Access: Stairs to enter Entrance Stairs-Rails: Can reach both Entrance Stairs-Number of Steps: 4   Home Layout: One level Home Equipment: Agricultural Consultant (2 wheels);Toilet riser;Wheelchair - manual;Cane - single point;Shower seat - built in Additional Comments:  prior to surgery up to 1/2 mile with SPC; has Left partial foot drop, left knee buckling.    Prior Function Prior Level of Function : Driving             Mobility Comments: amb with SPC home/community distances, no falls reported ADLs Comments: PRN assist for dressing from wife;     Extremity/Trunk Assessment   Upper Extremity Assessment Upper Extremity Assessment: Overall WFL for tasks assessed (grossly 4/5 throughout, grip strenghth appears grossly WFL bilaterally)    Lower Extremity Assessment Lower Extremity Assessment: Defer to PT evaluation (pt reports L foot drop, knee buckles at baseline)    Cervical / Trunk Assessment Cervical / Trunk Assessment: Back Surgery  Communication   Communication Communication: No apparent difficulties    Cognition Arousal: Alert Behavior During Therapy: WFL for tasks assessed/performed   PT - Cognitive impairments: No apparent impairments                                 Cueing       General Comments General comments (skin integrity, edema, etc.): pt is dizzy with position changes; unchanged with time in standing but does report it improves when seated    Exercises     Assessment/Plan    PT Assessment Patient needs continued PT services  PT Problem List Decreased strength;Decreased activity tolerance;Decreased balance;Decreased mobility;Decreased knowledge of use of DME       PT Treatment Interventions DME instruction;Gait training;Stair training;Functional mobility training;Therapeutic activities;Therapeutic exercise;Balance training;Neuromuscular re-education    PT Goals (Current goals can be found in the Care Plan section)  Acute Rehab PT Goals Patient Stated Goal: Regain safe walking for home DC PT Goal Formulation: With patient Time For Goal Achievement: 03/17/24 Potential to Achieve Goals: Good    Frequency Min 3X/week     Co-evaluation               AM-PAC PT 6 Clicks Mobility  Outcome  Measure Help needed turning from your back to your side while in a flat bed without using bedrails?: A Lot Help needed moving from lying on your back to sitting on the side of a flat bed without using bedrails?: A Lot Help needed moving to and from a bed to a chair (including a wheelchair)?: A Little Help needed standing up from a chair using your arms (e.g., wheelchair or bedside chair)?: A Little Help needed to walk in hospital room?: A Little Help needed climbing 3-5 steps with a railing? : A Lot 6 Click Score: 15    End of Session Equipment Utilized During Treatment: Gait belt Activity Tolerance: Treatment limited secondary to medical complications (Comment) Patient left: in bed;with family/visitor present;with call bell/phone within reach Nurse Communication:  (messaged MD/RN/OT about abnormal findings) PT Visit Diagnosis: Difficulty in walking, not elsewhere classified (R26.2);Other abnormalities of gait and mobility (R26.89);Other symptoms and signs involving the nervous system (R29.898)  Time: 1005-1030 PT Time Calculation (min) (ACUTE ONLY): 25 min   Charges:   PT Evaluation $PT Eval Moderate Complexity: 1 Mod   PT General Charges $$ ACUTE PT VISIT: 1 Visit       12:20 PM, 03/03/24 Peggye JAYSON Linear, PT, DPT Physical Therapist - [facility]  [phone] (ASCOM)    Merryn Thaker C 03/03/2024, 12:12 PM

## 2024-03-03 NOTE — Progress Notes (Signed)
 Transported pt to room 131 on ortho floor. Transported all pt's belongings with pt.  Medications from [facility] pharmacy placed in pt belongings bag.  Gave bedside report to [place], CHARITY FUNDRAISER.

## 2024-03-03 NOTE — Discharge Summary (Signed)
 [facility] Discharge Summary   Patient: Robert Maldonado FMW:[id number] DOB: 03-08-1966 PCP: Robert Oneil FALCON, MD  Date of admission: 03/02/2024  Date of Surgery: 03/02/2024  Surgery: Procedure(s) (LRB): Lumbar laminectomy medial facetectomy and lateral recess decompression, L3-S1 (N/A)  Date of discharge:  03/05/24  3 Days Post-Op  Discharge Condition: Fair  Recommendations at discharge:  Follow-up as planned.  Will initiate physical therapy officially around 6 weeks postoperatively.  Discharge Diagnoses: Principal Problem:   Lumbar stenosis Active Problems:   Spondylosis, lumbar, with myelopathy   Left leg weakness   Spinal stenosis, lumbar region, with neurogenic claudication  Resolved Problems:   * No resolved hospital problems. [facility] Course: The patient underwent L3-S1 lumbar laminectomy for severe lumbar claudication with radiculopathy symptoms.  Patient did well postoperatively was admitted to the postoperative care unit.  He is doing well in the morning.  Will have him work with physical therapy occupational therapy, he was deemed safe for discharge.  They are discharged on postoperative day 1  Consultants: PT OT Additional Procedures: None  Exam: Vitals:   03/05/24 0753  BP: 128/67  Pulse: 64  Resp: 17  Temp: 97.6 F (36.4 C)  SpO2: 98%   The physical exam is generally normal. Patient appears well, alert and oriented x 3, pleasant, cooperative. Vitals are as noted. Abdomen is soft, no tenderness. Extremities are normal.  Neurological exam is at his baseline.  Incision is closed, sterile dressing applied.  Some blood staining underneath the dressing but no active extravasation.  The results of significant diagnostics from this hospitalization (including imaging, microbiology, ancillary and laboratory) are listed below for reference.  Imaging Studies: DG Lumbar Spine 2-3 Views Result Date: 03/02/2024 CLINICAL DATA:  Elective surgery. EXAM:  LUMBAR SPINE - 2-3 VIEW COMPARISON:  None Available. FINDINGS: Three fluoroscopic spot views of the lumbar spine submitted from the operating room. Surgical instruments localize posteriorly from L3 through S1. Fluoroscopy time 6 seconds. Dose 3.87 mGy. IMPRESSION: Intraoperative fluoroscopy during lumbar surgery. Electronically Signed   By: Andrea Gasman M.D.   On: 03/02/2024 16:36   DG C-Arm 1-60 Min-No Report Result Date: 03/02/2024 Fluoroscopy was utilized by the requesting physician.  No radiographic interpretation.   DG C-Arm 1-60 Min-No Report Result Date: 03/02/2024 Fluoroscopy was utilized by the requesting physician.  No radiographic interpretation.    Microbiology: Results for orders placed or performed during the hospital encounter of 02/17/24  Surgical pcr screen     Status: None   Collection Time: 02/17/24  9:31 AM   Specimen: Nasal Mucosa; Nasal Swab  Result Value Ref Range Status   MRSA, PCR NEGATIVE NEGATIVE Final   Staphylococcus aureus NEGATIVE NEGATIVE Final    Comment: (NOTE) The Xpert SA Assay (FDA approved for NASAL specimens in patients 76 years of age and older), is one component of a comprehensive surveillance program. It is not intended to diagnose infection nor to guide or monitor treatment. Performed at [facility], [address] [place], [place] [address]     Labs:  CBC: No results for input(s): WBC, NEUTROABS, HGB, HCT, MCV, PLT in the last 168 hours. Basic Metabolic Panel: No results for input(s): NA, K, CL, CO2, GLUCOSE, BUN, CREATININE, CALCIUM , MG, PHOS in the last 168 hours. CBG: Recent Labs  Lab 03/04/24 0819 03/04/24 1128 03/04/24 1620 03/04/24 2006 03/05/24 0755  GLUCAP 96 134* 127* 98 94    Time spent: 35 minutes  Author: [name], MD  [facility]  [employer]

## 2024-03-03 NOTE — Plan of Care (Signed)
  Problem: Pain Management: Goal: Pain level will decrease Outcome: Progressing   

## 2024-03-03 NOTE — Evaluation (Signed)
 Occupational Therapy Evaluation Patient Details Name: [name] MRN: [id number] DOB: 21-May-1965 Today's Date: 03/03/2024   History of Present Illness   pt is a 58 year old male, s/p lumbar laminectomy, L3-S1 03/02/24; PMH significant for C5-7 ACDF on 06/08/23     Clinical Impressions Pt seen for OT evaluation this date, POD#1 from above procedure. PTA pt amb with SPC, reports no falls in the last 6 months and is generally MOD I-I in ADL with increased time, he does have PRN assist from his wife who will be home to help him. Pt educated in precautions, self care skills, AE, and home/routines modifications to maximize safety and functional independence while minimizing falls risk and maintaining precautions. Pt verbalized understanding of all education/training provided. Able to return demonstration safe techniques while maintaining precautions throughout.   Pt reports dizziness/peripheral double vision with position changes. He does not appear to be orthostatic and the dizziness is persistent while standing, improves with sitting. Nurse notified. OT will follow while admitted.    If plan is discharge home, recommend the following:   A little help with walking and/or transfers;A little help with bathing/dressing/bathroom     Functional Status Assessment   Patient has had a recent decline in their functional status and demonstrates the ability to make significant improvements in function in a reasonable and predictable amount of time.     Equipment Recommendations   None recommended by OT (pt has recommended equipment)     Recommendations for Other Services         Precautions/Restrictions   Precautions Precautions: Fall;Back Recall of Precautions/Restrictions: Intact Precaution/Restrictions Comments: no brace needed Restrictions Weight Bearing Restrictions Per Provider Order: No     Mobility Bed Mobility Overal bed mobility: Needs Assistance Bed Mobility:  Rolling, Sidelying to Sit Rolling: Supervision Sidelying to sit: Supervision, HOB elevated       General bed mobility comments: edu re body mechanics for back precautions    Transfers Overall transfer level: Needs assistance Equipment used: Rolling walker (2 wheels) Transfers: Sit to/from Stand Sit to Stand: Contact guard assist, From elevated surface (from elevated and regular surfaces)           General transfer comment: intermittent vcs for technique/body mechanics      Balance Overall balance assessment: Needs assistance Sitting-balance support: Feet supported Sitting balance-Leahy Scale: Good     Standing balance support: Bilateral upper extremity supported, Reliant on assistive device for balance, During functional activity Standing balance-Leahy Scale: Fair                             ADL either performed or assessed with clinical judgement   ADL Overall ADL's : Needs assistance/impaired Eating/Feeding: Set up   Grooming: Set up;Sitting           Upper Body Dressing : Set up   Lower Body Dressing: Supervision/safety;Sitting/lateral leans Lower Body Dressing Details (indicate cue type and reason): donn underwear, shorts; edu re compensatory techniques/AE for back preacutions Toilet Transfer: Contact guard assist;Rolling walker (2 wheels);Ambulation Toilet Transfer Details (indicate cue type and reason): simulated to bedside chair         Functional mobility during ADLs: Contact guard assist;Rolling walker (2 wheels) (amb approx 10' in room)       Vision Patient Visual Report: Diplopia Vision Assessment?: Yes Eye Alignment: Within Functional Limits Ocular Range of Motion: Within Functional Limits Additional Comments: pt reports double vision in his peripherial fields,  nurse notified     Perception         Praxis         Pertinent Vitals/Pain Pain Assessment Pain Assessment: 0-10 Pain Score: 8  Pain Location: LBP with  standing, no pain in sitting Pain Descriptors / Indicators: Discomfort Pain Intervention(s): Monitored during session, Repositioned, Limited activity within patient's tolerance     Extremity/Trunk Assessment Upper Extremity Assessment Upper Extremity Assessment: Overall WFL for tasks assessed (grossly 4/5 throughout, grip strenghth appears grossly WFL bilaterally)   Lower Extremity Assessment Lower Extremity Assessment: Defer to PT evaluation (pt reports L foot drop, knee buckles at baseline)   Cervical / Trunk Assessment Cervical / Trunk Assessment: Back Surgery   Communication Communication Communication: No apparent difficulties   Cognition Arousal: Alert Behavior During Therapy: WFL for tasks assessed/performed Cognition: No apparent impairments                               Following commands: Intact       Cueing  General Comments   Cueing Techniques: Verbal cues  pt is dizzy with position changes; unchanged with time in standing but does report it improves when seated   Exercises     Shoulder Instructions      Home Living Family/patient expects to be discharged to:: Private residence Living Arrangements: Spouse/significant other Available Help at Discharge: Family Type of Home: House Home Access: Stairs to enter Secretary/administrator of Steps: 4 Entrance Stairs-Rails: Can reach both Home Layout: One level     Bathroom Shower/Tub: Tub/shower unit;Walk-in shower   Bathroom Toilet: Handicapped height Bathroom Accessibility: Yes   Home Equipment: Agricultural Consultant (2 wheels);Toilet riser;Wheelchair - manual;Cane - single point;Shower seat - built in          Prior Functioning/Environment Prior Level of Function : Driving             Mobility Comments: amb with SPC home/community distances, no falls reported ADLs Comments: PRN assist for dressing from wife;    OT Problem List: Decreased strength;Decreased activity tolerance;Decreased  knowledge of use of DME or AE;Impaired balance (sitting and/or standing)   OT Treatment/Interventions: Self-care/ADL training;Therapeutic exercise;Energy conservation;DME and/or AE instruction;Therapeutic activities;Balance training      OT Goals(Current goals can be found in the care plan section)   Acute Rehab OT Goals Patient Stated Goal: go home OT Goal Formulation: With patient Time For Goal Achievement: 03/17/24 Potential to Achieve Goals: Good ADL Goals Pt Will Perform Grooming: with modified independence;sitting;standing Pt Will Perform Lower Body Dressing: with modified independence;sitting/lateral leans;sit to/from stand Pt Will Transfer to Toilet: with modified independence;ambulating Pt Will Perform Toileting - Clothing Manipulation and hygiene: with modified independence;sit to/from stand;sitting/lateral leans   OT Frequency:  Min 3X/week    Co-evaluation              AM-PAC OT 6 Clicks Daily Activity     Outcome Measure Help from another person eating meals?: None Help from another person taking care of personal grooming?: None Help from another person toileting, which includes using toliet, bedpan, or urinal?: A Little Help from another person bathing (including washing, rinsing, drying)?: A Little Help from another person to put on and taking off regular upper body clothing?: None Help from another person to put on and taking off regular lower body clothing?: A Little 6 Click Score: 21   End of Session Equipment Utilized During Treatment: Gait belt;Rolling walker (2 wheels) Nurse  Communication: Mobility status (vision)  Activity Tolerance: Patient tolerated treatment well Patient left: in chair;with call bell/phone within reach  OT Visit Diagnosis: Other abnormalities of gait and mobility (R26.89)                Time: 9155-9089 OT Time Calculation (min): 26 min Charges:  OT General Charges $OT Visit: 1 Visit OT Evaluation $OT Eval Moderate  Complexity: 1 Mod  Therisa Sheffield, OTD OTR/L  03/03/24, 9:23 AM

## 2024-03-03 NOTE — Discharge Instructions (Addendum)
 Your surgeon has performed an operation on your lumbar spine (low back) to relieve pressure on one or more nerves. Many times, patients feel better immediately after surgery and can "overdo it." Even if you feel well, it is important that you follow these activity guidelines. If you do not let your back heal properly from the surgery, you can increase the chance of a disc herniation and/or return of your symptoms. The following are instructions to help in your recovery once you have been discharged from the hospital.  * It is ok to take NSAIDs after surgery.  You were given a small prescription of pain medication, while you are taking this you should also take a stool softener.  A stool softener was prescribed for you, commonly these are not covered by insurance, should this be the case you can often obtain this over-the-counter for a lower price.  Activity    No bending, lifting, or twisting ("BLT"). Avoid lifting objects heavier than 10 pounds (gallon milk jug).  Where possible, avoid household activities that involve lifting, bending, pushing, or pulling such as laundry, vacuuming, grocery shopping, and childcare. Try to arrange for help from friends and family for these activities while your back heals.  Increase physical activity slowly as tolerated.  Taking short walks is encouraged, but avoid strenuous exercise. Do not jog, run, bicycle, lift weights, or participate in any other exercises unless specifically allowed by your doctor. Avoid prolonged sitting, including car rides.  Talk to your doctor before resuming sexual activity.  You should not drive until cleared by your doctor.  Until released by your doctor, you should not return to work or school.  You should rest at home and let your body heal.   You may shower three days after your surgery.  After showering, lightly dab your incision dry. Do not take a tub bath or go swimming for 3 weeks, or until approved by your doctor at your  follow-up appointment.  If you smoke, we strongly recommend that you quit.  Smoking has been proven to interfere with normal healing in your back and will dramatically reduce the success rate of your surgery. Please contact [facility] (800-QUIT-NOW) and use the resources at www.[facility].com for assistance in stopping smoking.  Surgical Incision   Ok to remove dressing on 03/07/2024. Keep your incision area clean and dry.  If you have staples or stitches on your incision, you should have a follow up scheduled for removal. If you do not have staples or stitches, you will have steri-strips (small pieces of surgical tape) or Dermabond glue. The steri-strips/glue should begin to peel away within about a week (it is fine if the steri-strips fall off before then). If the strips are still in place one week after your surgery, you may gently remove them.  Diet            You may return to your usual diet. Be sure to stay hydrated.  When to Contact Us   Although your surgery and recovery will likely be uneventful, you may have some residual numbness, aches, and pains in your back and/or legs. This is normal and should improve in the next few weeks.  However, should you experience any of the following, contact us  immediately: New numbness or weakness Pain that is progressively getting worse, and is not relieved by your pain medications or rest Bleeding, redness, swelling, pain, or drainage from surgical incision Chills or flu-like symptoms Fever greater than 101.0 F (38.3 C) Problems with bowel or  bladder functions Difficulty breathing or shortness of breath Warmth, tenderness, or swelling in your calf  Contact Information How to contact us :  If you have any questions/concerns before or after surgery, you can reach us  at [phone], or you can send a mychart message. We can be reached by phone or mychart 8am-4pm, Monday-Friday.  *Please note: Calls after 4pm are forwarded to a third party  answering service. Mychart messages are not routinely monitored during evenings, weekends, and holidays. Please call our office to contact the answering service for urgent concerns during non-business hours.

## 2024-03-04 LAB — GLUCOSE, CAPILLARY
Glucose-Capillary: 96 mg/dL (ref 70–99)
Glucose-Capillary: 134 mg/dL — ABNORMAL HIGH (ref 70–99)
Glucose-Capillary: 127 mg/dL — ABNORMAL HIGH (ref 70–99)
Glucose-Capillary: 98 mg/dL (ref 70–99)

## 2024-03-04 MED ORDER — LACTATED RINGERS IV SOLN: INTRAVENOUS | Status: DC

## 2024-03-04 MED ORDER — TRAMADOL HCL 50 MG PO TABS
50.0000 mg | ORAL_TABLET | Freq: Four times a day (QID) | ORAL | Status: DC | PRN
  Administered 2024-03-04 – 2024-03-06 (×7): 50 mg via ORAL
  Filled 2024-03-04 (×7): qty 1

## 2024-03-04 NOTE — Progress Notes (Signed)
 Physical Therapy Treatment Patient Details Name: Robert Maldonado MRN: [id number] DOB: [birth year] Today's Date: 03/04/2024   History of Present Illness Robert Maldonado is a 58yoM who presents to [facility] 03/02/24 for elective Left L3-S1 laminectomy in the setting of lumbar spinal stenosis, myelopathy, LLE pain, weakness, myoclonus. PMH: ACDF.    PT Comments  Pt was side lying in bed upon arrival. He is A and O x 4. Motivated and eager to attempt OOB activity in hopes to go home later this date. Pt demonstrated safe abilities to exit R side of bed with increased time however no physical assist. Sat EOB x > 5 minutes to allow his slight dizziness to resolve. BP while sitting EOB; 150/80 (101) in standing BP 130/95 (106). Pt only able to tolerate standing 2 x ~ 20 sec with pt having severe onset of dizziness and beginning to profusely sweat. Diaphoresis and dizziness resolves after a few minutes however he still endorse not feeling right. MD was made aware. Sao2 and HR were WNL throughout session on rm air. Acute PT will continue to follow. DC recs remain appropriate once pt medically able to tolerate increased OOB/activity.      If plan is discharge home, recommend the following: A little help with walking and/or transfers;A little help with bathing/dressing/bathroom;Assistance with cooking/housework;Direct supervision/assist for medications management;Assist for transportation;Help with stairs or ramp for entrance     Equipment Recommendations  Rolling walker (2 wheels)       Precautions / Restrictions Precautions Precautions: Fall;Back Recall of Precautions/Restrictions: Intact Precaution/Restrictions Comments: no brace needed Restrictions Weight Bearing Restrictions Per Provider Order: No     Mobility  Bed Mobility Overal bed mobility: Needs Assistance Bed Mobility: Rolling, Sidelying to Sit, Supine to Sit Rolling: Supervision Sidelying to sit: Supervision, HOB elevated Supine to sit:  Supervision   Transfers Overall transfer level: Needs assistance Equipment used: Rolling walker (2 wheels) Transfers: Sit to/from Stand Sit to Stand: Contact guard assist, Supervision  General transfer comment: Pt endorses severe dizziness and becomes diaphramatic. Unable to stand for > 20 sec before needing to sit due to concerns of syncopal episode    Ambulation/Gait  General Gait Details: elected not to ambulate away form EOB due to severity of dizziness and profusely sweating concerns with minimal activity   Balance Overall balance assessment: Needs assistance Sitting-balance support: Feet supported Sitting balance-Leahy Scale: Good     Standing balance support: Bilateral upper extremity supported, During functional activity, Reliant on assistive device for balance Standing balance-Leahy Scale: Fair Standing balance comment: high fall risk due to concerns of dizziness      Communication Communication Communication: No apparent difficulties  Cognition Arousal: Alert Behavior During Therapy: WFL for tasks assessed/performed   PT - Cognitive impairments: No apparent impairments    PT - Cognition Comments: Pt is A and O x 4 Following commands: Intact      Cueing Cueing Techniques: Verbal cues, Tactile cues         Pertinent Vitals/Pain Pain Assessment Pain Assessment: 0-10 Pain Score: 4  Pain Location: L hip Pain Descriptors / Indicators: Discomfort, Grimacing, Guarding Pain Intervention(s): Limited activity within patient's tolerance, Monitored during session, Premedicated before session, Repositioned     PT Goals (current goals can now be found in the care plan section) Acute Rehab PT Goals Patient Stated Goal: decreased dizziness Progress towards PT goals: Not progressing toward goals - comment (concerns of dizziness/ diaphoresis upon standing)    Frequency    Min 3X/week  AM-PAC PT 6 Clicks Mobility   Outcome Measure  Help needed turning from  your back to your side while in a flat bed without using bedrails?: A Little Help needed moving from lying on your back to sitting on the side of a flat bed without using bedrails?: A Little Help needed moving to and from a bed to a chair (including a wheelchair)?: A Little Help needed standing up from a chair using your arms (e.g., wheelchair or bedside chair)?: A Little Help needed to walk in hospital room?: A Little Help needed climbing 3-5 steps with a railing? : A Little 6 Click Score: 18    End of Session   Activity Tolerance: Treatment limited secondary to medical complications (Comment) (limited by dizziness upon standing) Patient left: in chair;with call bell/phone within reach Nurse Communication: Mobility status PT Visit Diagnosis: Difficulty in walking, not elsewhere classified (R26.2);Other abnormalities of gait and mobility (R26.89);Other symptoms and signs involving the nervous system (R29.898);Dizziness and giddiness (R42);Unsteadiness on feet (R26.81);Muscle weakness (generalized) (M62.81)     Time: 8961-8949 PT Time Calculation (min) (ACUTE ONLY): 12 min  Charges:    $Therapeutic Activity: 8-22 mins PT General Charges $$ ACUTE PT VISIT: 1 Visit                     Rankin Essex PTA 03/04/24, 11:17 AM

## 2024-03-04 NOTE — Progress Notes (Signed)
 Attending Progress Note  History: Robert Maldonado is here for lumbar decompression, they are currently 2 Days Post-Op.   POD2: Got dizzy yesterday with PTOT.  Feeling better today. POD1: Doing well.  Midline back pain but feels like his legs feel better already.  Physical Exam: Vitals:   03/04/24 0522 03/04/24 0818  BP:  121/71  Pulse:  74  Resp: 16 17  Temp:  99.1 F (37.3 C)  SpO2:  100%    AA Ox3 CNI  Strength: No new physical deficits on physical examination, continues to have slight foot drop.  Dressing c/d/i  Data:  Other tests/results: No new testing  Assessment/Plan:  Robert Maldonado postoperative day 1 from a lumbar laminectomy, L3-S1.  Doing well in recovery.  Plan for likely discharge today  - Drains: Removed - mobilize - pain control - DVT prophylaxis - PTOT, plans for discharge after PT OT evaluation if appropriate  [name] Department of Neurosurgery

## 2024-03-04 NOTE — Progress Notes (Addendum)
 This RN rounded on pt. Wife at bedside with questions and concerns, specifically relating to pt having taken previous doses of 100 mg Tramadol  (instead of 50 mg home dose) during this admission. Wife's concerns also relating to reasoning behind why pt is receiving both NovoLOG  insulin  and Jardiance .  MD notified. Pt's wife reassurred that pt's current Tramadol  dose is 50 mg PRN. Education provided on medications by this RN. Verbal order by MD to discontinue NovoLOG  insulin  at pt's request. Charge RN notified. Pt in NAD. VSS.

## 2024-03-04 NOTE — Plan of Care (Signed)
  Problem: Activity: Goal: Ability to avoid complications of mobility impairment will improve Outcome: Progressing Goal: Ability to tolerate increased activity will improve Outcome: Progressing Goal: Will remain free from falls Outcome: Progressing   Problem: Clinical Measurements: Goal: Ability to maintain clinical measurements within normal limits will improve Outcome: Progressing Goal: Postoperative complications will be avoided or minimized Outcome: Progressing Goal: Diagnostic test results will improve Outcome: Progressing

## 2024-03-04 NOTE — Plan of Care (Signed)

## 2024-03-05 ENCOUNTER — Other Ambulatory Visit: Payer: Self-pay | Admitting: Neurosurgery

## 2024-03-05 DIAGNOSIS — R29898 Other symptoms and signs involving the musculoskeletal system: Secondary | ICD-10-CM

## 2024-03-05 DIAGNOSIS — R55 Syncope and collapse: Secondary | ICD-10-CM

## 2024-03-05 DIAGNOSIS — R42 Dizziness and giddiness: Secondary | ICD-10-CM | POA: Diagnosis not present

## 2024-03-05 DIAGNOSIS — M4716 Other spondylosis with myelopathy, lumbar region: Secondary | ICD-10-CM | POA: Diagnosis not present

## 2024-03-05 DIAGNOSIS — M48062 Spinal stenosis, lumbar region with neurogenic claudication: Secondary | ICD-10-CM | POA: Diagnosis not present

## 2024-03-05 LAB — COMPREHENSIVE METABOLIC PANEL WITH GFR
ALT: 23 U/L (ref 0–44)
AST: 26 U/L (ref 15–41)
Albumin: 3.7 g/dL (ref 3.5–5.0)
Alkaline Phosphatase: 76 U/L (ref 38–126)
Anion gap: 11 (ref 5–15)
BUN: 15 mg/dL (ref 6–20)
CO2: 25 mmol/L (ref 22–32)
Calcium: 9.3 mg/dL (ref 8.9–10.3)
Chloride: 100 mmol/L (ref 98–111)
Creatinine, Ser: 1.31 mg/dL — ABNORMAL HIGH (ref 0.61–1.24)
GFR, Estimated: >60 mL/min (ref >60)
Glucose, Bld: 93 mg/dL (ref 70–99)
Potassium: 4.1 mmol/L (ref 3.5–5.1)
Sodium: 136 mmol/L (ref 135–145)
Total Bilirubin: 0.9 mg/dL (ref 0.0–1.2)
Total Protein: 6.7 g/dL (ref 6.5–8.1)

## 2024-03-05 LAB — GLUCOSE, CAPILLARY
Glucose-Capillary: 94 mg/dL (ref 70–99)
Glucose-Capillary: 104 mg/dL — ABNORMAL HIGH (ref 70–99)
Glucose-Capillary: 82 mg/dL (ref 70–99)

## 2024-03-05 LAB — CBC
HCT: 39.6 % (ref 39.0–52.0)
Hemoglobin: 13.5 g/dL (ref 13.0–17.0)
MCH: 30.7 pg (ref 26.0–34.0)
MCHC: 34.1 g/dL (ref 30.0–36.0)
MCV: 90 fL (ref 80.0–100.0)
Platelets: 182 K/uL (ref 150–400)
RBC: 4.4 MIL/uL (ref 4.22–5.81)
RDW: 12.7 % (ref 11.5–15.5)
WBC: 7.9 K/uL (ref 4.0–10.5)
nRBC: 0 % (ref 0.0–0.2)

## 2024-03-05 LAB — TSH: TSH: 1.27 u[IU]/mL (ref 0.350–4.500)

## 2024-03-05 MED ORDER — ORAL CARE MOUTH RINSE: 15.0000 mL | OROMUCOSAL | Status: DC | PRN

## 2024-03-05 MED ORDER — TRAZODONE HCL 50 MG PO TABS
50.0000 mg | ORAL_TABLET | Freq: Every evening | ORAL | Status: DC | PRN
  Administered 2024-03-05: 100 mg via ORAL
  Administered 2024-03-06: 50 mg via ORAL
  Administered 2024-03-07 – 2024-03-08 (×2): 100 mg via ORAL
  Filled 2024-03-05 (×3): qty 2
  Filled 2024-03-05: qty 1

## 2024-03-05 NOTE — TOC Progression Note (Signed)
 Transition of Care [facility]) - Progression Note    Patient Details  Name: Robert Maldonado MRN: [id number] Date of Birth: February 12, 1966  [facility]) CM/SW Contact  Victory Jackquline RAMAN, RN Phone Number: 03/05/2024, 10:59 AM  Clinical Narrative: RNCM received a message via secure chat from PT stating that they were unable to work with the patient today secondary to him being really orthostatic/diaphoretic when standing. Unable to walk due to dizziness. MD made aware and canceling discharge orders and having additional labs drawn and asking the medical team to see patient. RNCM will continue to follow for discharge planning needs.    Expected Discharge Plan: Home w Home Health Services Barriers to Discharge: Continued Medical Work up               Expected Discharge Plan and Services   Discharge Planning Services: CM Consult Post Acute Care Choice: Home Health Living arrangements for the past 2 months: Single Family Home Expected Discharge Date: 03/05/24               DME Arranged: N/A         HH Arranged: OT, PT HH Agency: [facility] Date HH Agency Contacted: 03/03/24 Time HH Agency Contacted: 1346 Representative spoke with at [facility] Agency: Shaun   Social Drivers of Health (SDOH) Interventions SDOH Screenings   Food Insecurity: No Food Insecurity (03/02/2024)  Housing: Low Risk  (03/02/2024)  Transportation Needs: No Transportation Needs (03/02/2024)  Utilities: Not At Risk (03/02/2024)  Financial Resource Strain: Medium Risk (10/07/2023)   Received from [facility]  Tobacco Use: Low Risk  (03/02/2024)    Readmission Risk Interventions     No data to display

## 2024-03-05 NOTE — Plan of Care (Signed)
  Problem: Education: Goal: Ability to describe self-care measures that may prevent or decrease complications (Diabetes Survival Skills Education) will improve Outcome: Progressing   Problem: Coping: Goal: Ability to adjust to condition or change in health will improve Outcome: Progressing   Problem: Coping: Goal: Ability to adjust to condition or change in health will improve Outcome: Progressing   Problem: Coping: Goal: Ability to adjust to condition or change in health will improve Outcome: Progressing   Problem: Skin Integrity: Goal: Risk for impaired skin integrity will decrease Outcome: Progressing   Problem: Activity: Goal: Will remain free from falls Outcome: Progressing   Problem: Pain Management: Goal: Pain level will decrease Outcome: Progressing   Problem: Pain Management: Goal: Pain level will decrease Outcome: Progressing

## 2024-03-05 NOTE — Progress Notes (Signed)
 Attending Progress Note  History: Robert Maldonado is here for lumbar decompression, they are currently 3 Days Post-Op.   POD3: Had difficulty with orthostasis and dizziness yesterday, was unable to walk to bathroom safely. POD2: Got dizzy yesterday with PTOT.  Feeling better today. POD1: Doing well.  Midline back pain but feels like his legs feel better already.  Physical Exam: Vitals:   03/05/24 0331 03/05/24 0753  BP: 112/81 128/67  Pulse: 72 64  Resp: 17 17  Temp: 98.9 F (37.2 C) 97.6 F (36.4 C)  SpO2: 98% 98%    AA Ox3 CNI  Strength: No new physical deficits on physical examination, continues to have slight foot drop.  Dressing c/d/i  Data:  Other tests/results: No new testing  Assessment/Plan:  Robert Maldonado has had continued dizziness and orthostasis after surgery.     - Drains: Removed - mobilize - pain control - DVT prophylaxis - PTOT, plans for discharge after PT OT evaluation if appropriate  Robert Maldonado Department of Neurosurgery

## 2024-03-05 NOTE — Consult Note (Signed)
 Initial Consultation Note   Patient: Robert Maldonado FMW:[id number] DOB: 1965/07/21 PCP: Cleotilde Oneil FALCON, MD DOA: 03/02/2024 DOS: the patient was seen and examined on 03/05/2024 Primary service: Claudene Penne ORN, MD  Referring physician: Neurosurgery Reason for consult: Dizziness  Assessment/Plan: Dizziness upon standing Near Syncope - Orthostatic vitals revealed blood pressure lying 132/83, blood pressure standing 94/69, pulse change less than 15 bpm.  Blood pressure normalizes at 3-minute interval --Orthostasis currently worsened by higher-dose opioids.  Patient is back into his normal dose of tramadol  now.  Patient also on atenolol  chronically outpatient, baseline pulse 70 and under.  This may be blunting his chronotropic response when standing.  Will discontinue atenolol  for now and monitor closely. - CBC, CMP, TSH all WNL - Obtain echocardiogram - Placed in 24 hours of telemetry to rule out arrhythmia - Patient reports that the dizziness is starting to improve compared to 3 days ago.  Was having positional dizziness even in bed 3 days ago.  Now positional dizziness only when standing.  This may be BPPV.  If all other workup is negative and patient continues to improve, we will assume vertigo likely provoked by recent surgery.  If dizziness is persistent consider MRI to rule out posterior CVA - Continue working PT/OT  Lumbar stenosis status post laminectomy - OR 11/20, neurosurgery primary service - Continue with as needed pain control.  Emphasize multimodal pain control to reduce opioid burden and impact on blood pressure if able - Continue PT/OT  CKD3a - Baseline creatinine near 1.4, currently 1.3 - Avoid nephrotoxic medications  Depression - On Paxil , continue  Hypertension -On atenolol  outpatient.  No history of cardiac arrhythmias. - Discontinue atenolol  for now given orthostasis as above.  May benefit from better antihypertensives at DC, recommend amlodipine 5mg  if blood  pressure continues to be elevated  Rheumatoid arthritis - Not currently on Biologics, does not currently follow with rheumatology  Prediabetes - HgbA1c 5.7% - CBGs have been well-controlled while admitted  TRH will continue to follow the patient.  HPI: Robert Maldonado is a 58 y.o. male with history of depression, peripheral neuropathy, prediabetes, rheumatoid arthritis, And lumbar stenosis.  He was admitted 11/20 and a primary neurosurgery service for lumbar laminectomy and medial facetectomy for lateral recess decompression L3-S1.  Postoperative course reportedly uncomplicated until patient became diaphoretic and dizzy upon standing with physical therapy.  We have been consulted to work this up.  Patient reports for the last 3 days.  He has been dizzy with position changes.  He reports he was initially dizzy even when moving head side-to-side in bed though this has since resolved and now he is dizzy only when going from laying to sitting, to standing.  He reports this is associated with diaphoresis and near syncope  Review of Systems: As mentioned in the history of present illness. All other systems reviewed and are negative. Past Medical History:  Diagnosis Date   B12 deficiency    Cellulitis and abscess of foot 09/27/2016   Cervical disc disorder with radiculopathy of cervical region 05/17/2023   Cervical myelopathy (HCC) 03/18/2023   Gait abnormality 03/18/2023   History of depression    History of rectal bleeding    Hypertension    Left arm weakness 05/19/2023   Peripheral neuropathic pain    PONV (postoperative nausea and vomiting)    Pre-diabetes    Rheumatoid arthritis (HCC)    Spondylosis, cervical, with myelopathy 02/17/2023   Past Surgical History:  Procedure Laterality Date  ANTERIOR CERVICAL DECOMP/DISCECTOMY FUSION N/A 06/08/2023   Procedure: C5-7 ANTERIOR CERVICAL DISCECTOMY AND FUSION (FORGE);  Surgeon: Claudene Penne ORN, MD;  Location: [facility] ORS;  Service:  Neurosurgery;  Laterality: N/A;   BACK SURGERY     COLONOSCOPY  2020   EXCISION, CYST, RIGHT LITTLE FINGER Right 2015   HAND SURGERY     INCISION & DRAINAGE ABSCESS ANKLE/FOOT/TOE     IRRIGATION AND DEBRIDEMENT FOOT Left 09/28/2016   Procedure: IRRIGATION AND DEBRIDEMENT FOOT;  Surgeon: Lilli Cough, DPM;  Location: [facility] ORS;  Service: Podiatry;  Laterality: Left;   LEG SURGERY Left    LUMBAR LAMINECTOMY/DECOMPRESSION MICRODISCECTOMY N/A 03/02/2024   Procedure: Lumbar laminectomy medial facetectomy and lateral recess decompression, L3-S1;  Surgeon: Claudene Penne ORN, MD;  Location: [facility] ORS;  Service: Neurosurgery;  Laterality: N/A;   Social History:  reports that he has never smoked. He has never used smokeless tobacco. He reports current drug use. Drug: Marijuana. He reports that he does not drink alcohol.  Allergies  Allergen Reactions   Oxycodone  Nausea And Vomiting    Family History  Problem Relation Age of Onset   Diabetes Father     Prior to Admission medications   Medication Sig Start Date End Date Taking? Authorizing Provider  amitriptyline  (ELAVIL ) 50 MG tablet Take 50 mg by mouth at bedtime.   Yes [provider]  atenolol  (TENORMIN ) 25 MG tablet Take 25 mg by mouth daily.   Yes [provider]  cholecalciferol  (VITAMIN D ) 1000 units tablet Take 1,000 Units by mouth 2 (two) times daily.   Yes [provider]  clotrimazole -betamethasone (LOTRISONE) cream Apply 1 Application topically daily. 02/07/24  Yes [provider]  diclofenac (VOLTAREN) 75 MG EC tablet Take 75 mg by mouth 2 (two) times daily.   Yes [provider]  empagliflozin  (JARDIANCE ) 10 MG TABS tablet Take 10 mg by mouth daily. 12/02/23 12/01/24 Yes [provider]  gabapentin  (NEURONTIN ) 800 MG tablet Take 1,600 mg by mouth 3 (three) times daily.   Yes [provider]  metFORMIN (GLUCOPHAGE) 500 MG tablet Take 500 mg by mouth every morning.  02/09/24  Yes [provider]  methocarbamol  (ROBAXIN ) 750 MG tablet Take 1 tablet (750 mg total) by mouth 3 (three) times daily as needed for muscle spasms. 06/09/23  Yes Gregory Edsel Ruth, PA  omeprazole (PRILOSEC) 20 MG capsule Take 20 mg by mouth daily.   Yes [provider]  PARoxetine  (PAXIL ) 20 MG tablet Take 20 mg by mouth daily. 05/15/22 03/02/24 Yes [provider]  traMADol  (ULTRAM ) 50 MG tablet Take 1-2 tablets (50-100 mg total) by mouth every 4 (four) hours as needed for moderate pain (pain score 4-6) or severe pain (pain score 7-10). Patient taking differently: Take 50-100 mg by mouth 5 (five) times daily as needed for moderate pain (pain score 4-6) or severe pain (pain score 7-10). 06/09/23  Yes Gregory Edsel Ruth, PA  traZODone  (DESYREL ) 50 MG tablet Take 50-100 mg by mouth at bedtime as needed for sleep. 12/02/23  Yes [provider]  vitamin B-12 (CYANOCOBALAMIN ) 1000 MCG tablet Take 1,000 mcg by mouth 2 (two) times daily.   Yes [provider]  acetaminophen  (TYLENOL ) 500 MG tablet Take 2 tablets (1,000 mg total) by mouth every 8 (eight) hours for 7 days. 03/03/24 03/10/24  Claudene Penne ORN, MD  Misc. Devices (ROLLATOR ULTRA-LIGHT) MISC Gait abnormality 05/07/23   Onita Duos, MD  traMADol  (ULTRAM ) 50 MG tablet Take 1-2 tablets (50-100  mg total) by mouth every 6 (six) hours for 7 days. 03/03/24 03/10/24  Claudene Penne ORN, MD    Physical Exam: Vitals:   03/04/24 1552 03/04/24 1952 03/05/24 0331 03/05/24 0753  BP: 133/82 123/71 112/81 128/67  Pulse: 78 69 72 64  Resp:  17 17 17   Temp:  99 F (37.2 C) 98.9 F (37.2 C) 97.6 F (36.4 C)  TempSrc:  Oral Oral   SpO2:  96% 98% 98%  Weight:      Height:       Constitutional:  Normal appearance. Non toxic-appearing.  HENT: Head Normocephalic and atraumatic.  Mucous membranes are moist.  Eyes:  Extraocular intact. Conjunctivae normal. Pupils are equal, round, and reactive to light.   Cardiovascular: Rate and Rhythm: Normal rate and regular rhythm.  Pulmonary: Non labored, symmetric rise of chest wall.  Skin: warm and dry. not jaundiced.  Neurological: No focal deficit present. alert. Oriented. Psychiatric: Mood and Affect congruent.   Data Reviewed:     Latest Ref Rng & Units 03/05/2024   11:34 AM 02/17/2024    9:27 AM 05/26/2023    2:02 PM  CBC  WBC 4.0 - 10.5 K/uL 7.9  5.3  5.6   Hemoglobin 13.0 - 17.0 g/dL 86.4  84.0  84.0   Hematocrit 39.0 - 52.0 % 39.6  47.2  47.2   Platelets 150 - 400 K/uL 182  240  250    DG Lumbar Spine 2-3 Views CLINICAL DATA:  Elective surgery.  EXAM: LUMBAR SPINE - 2-3 VIEW  COMPARISON:  None Available.  FINDINGS: Three fluoroscopic spot views of the lumbar spine submitted from the operating room. Surgical instruments localize posteriorly from L3 through S1. Fluoroscopy time 6 seconds. Dose 3.87 mGy.  IMPRESSION: Intraoperative fluoroscopy during lumbar surgery.  Electronically Signed   By: Andrea Gasman M.D.   On: 03/02/2024 16:36 DG C-Arm 1-60 Min-No Report Fluoroscopy was utilized by the requesting physician.  No radiographic  interpretation.  DG C-Arm 1-60 Min-No Report Fluoroscopy was utilized by the requesting physician.  No radiographic  interpretation.      Latest Ref Rng & Units 03/05/2024   11:34 AM 02/17/2024    9:27 AM 05/26/2023    2:02 PM  BMP  Glucose 70 - 99 mg/dL 93  99  92   BUN 6 - 20 mg/dL 15  9  13    Creatinine 0.61 - 1.24 mg/dL 8.68  8.55  8.61   Sodium 135 - 145 mmol/L 136  137  137   Potassium 3.5 - 5.1 mmol/L 4.1  4.0  4.4   Chloride 98 - 111 mmol/L 100  101  103   CO2 22 - 32 mmol/L 25  23  24    Calcium  8.9 - 10.3 mg/dL 9.3  9.0  9.0      Family Communication: Discussed directly with patient as well as his wife, Nat, on the phone Primary team communication: Updated Nsgy.  Thank you very much for involving us  in the care of your patient.  Author: Bernadene Garside,  DO 03/05/2024 11:07 AM  For on call review [email or web address].

## 2024-03-05 NOTE — Plan of Care (Signed)

## 2024-03-05 NOTE — Progress Notes (Signed)
 Physical Therapy Treatment Patient Details Name: Robert Maldonado MRN: [id number] DOB: May 22, 1965 Today's Date: 03/05/2024   History of Present Illness Robert Maldonado is a 58yoM who presents to [facility] 03/02/24 for elective Left L3-S1 laminectomy in the setting of lumbar spinal stenosis, myelopathy, LLE pain, weakness, myoclonus. PMH: ACDF.    PT Comments  Pt Robert Maldonado sitting in bed with some c/o dizziness upon transition upright.  He is able to get to EOB and stand with RW with min a x 2 for safety for orthostatic BP's.  Pt diaphoretic upon completion of standing BP's.  While he is able to sidestep along bed, he does not feel he is able to walk and given BP response and diaphoresis he chooses to return to supine.   Supine 132/83 P 61 Sitting 118/43 P 67 Standing 94/69 P 74 Standing 3 150/102 P 79 which is double checked for accuracy. Will reach out to team vis secure chat.    If plan is discharge home, recommend the following: A little help with walking and/or transfers;A little help with bathing/dressing/bathroom;Assistance with cooking/housework;Direct supervision/assist for medications management;Assist for transportation;Help with stairs or ramp for entrance   Can travel by private vehicle        Equipment Recommendations  Rolling walker (2 wheels)    Recommendations for Other Services       Precautions / Restrictions Precautions Precautions: Fall;Back Precaution/Restrictions Comments: no brace needed Restrictions Other Position/Activity Restrictions: BP's/dizziness     Mobility  Bed Mobility Overal bed mobility: Modified Independent Bed Mobility: Supine to Sit, Sit to Supine Rolling: Used rails   Supine to sit: Used rails Sit to supine: Used rails     Patient Response: Cooperative  Transfers Overall transfer level: Needs assistance Equipment used: Rolling walker (2 wheels) Transfers: Sit to/from Stand Sit to Stand: Contact guard assist, Supervision, +2 safety/equipment                 Ambulation/Gait         Gait velocity: decreased     General Gait Details: limited due to BP, dizziness and general malaise but stated it is improved over yesterday   Stairs             Wheelchair Mobility     Tilt Bed Tilt Bed Patient Response: Cooperative  Modified Rankin (Stroke Patients Only)       Balance Overall balance assessment: Needs assistance Sitting-balance support: Feet supported Sitting balance-Leahy Scale: Good     Standing balance support: Bilateral upper extremity supported, During functional activity, Reliant on assistive device for balance Standing balance-Leahy Scale: Fair Standing balance comment: high fall risk due to concerns of dizziness                            Communication Communication Communication: No apparent difficulties  Cognition Arousal: Alert Behavior During Therapy: WFL for tasks assessed/performed   PT - Cognitive impairments: No apparent impairments                         Following commands: Intact      Cueing Cueing Techniques: Verbal cues, Tactile cues  Exercises      General Comments        Pertinent Vitals/Pain Pain Assessment Pain Assessment: Faces Faces Pain Scale: Hurts even more Pain Location: back Pain Descriptors / Indicators: Discomfort, Grimacing, Guarding Pain Intervention(s): Limited activity within patient's tolerance, Monitored during session, Repositioned  Home Living                          Prior Function            PT Goals (current goals can now be found in the care plan section) Progress towards PT goals: Not progressing toward goals - comment    Frequency    Min 3X/week      PT Plan      Co-evaluation              AM-PAC PT 6 Clicks Mobility   Outcome Measure  Help needed turning from your back to your side while in a flat bed without using bedrails?: A Little Help needed moving from lying on your  back to sitting on the side of a flat bed without using bedrails?: A Little Help needed moving to and from a bed to a chair (including a wheelchair)?: A Little Help needed standing up from a chair using your arms (e.g., wheelchair or bedside chair)?: A Little Help needed to walk in hospital room?: A Little Help needed climbing 3-5 steps with a railing? : A Little 6 Click Score: 18    End of Session   Activity Tolerance: Treatment limited secondary to medical complications (Comment) (limited by dizziness upon standing) Patient left: with call bell/phone within reach;in bed;with bed alarm set Nurse Communication: Mobility status PT Visit Diagnosis: Difficulty in walking, not elsewhere classified (R26.2);Other abnormalities of gait and mobility (R26.89);Other symptoms and signs involving the nervous system (R29.898);Dizziness and giddiness (R42);Unsteadiness on feet (R26.81);Muscle weakness (generalized) (M62.81)     Time: 9042-8987 PT Time Calculation (min) (ACUTE ONLY): 15 min  Charges:    $Therapeutic Activity: 8-22 mins PT General Charges $$ ACUTE PT VISIT: 1 Visit                   Lauraine Gills, PTA 03/05/24, 10:39 AM

## 2024-03-06 ENCOUNTER — Inpatient Hospital Stay
Admission: RE | Admit: 2024-03-06 | Discharge: 2024-03-06 | Disposition: A | Source: Ambulatory Visit | Attending: Family Medicine | Admitting: Neurosurgery

## 2024-03-06 DIAGNOSIS — M48061 Spinal stenosis, lumbar region without neurogenic claudication: Secondary | ICD-10-CM

## 2024-03-06 DIAGNOSIS — R55 Syncope and collapse: Secondary | ICD-10-CM | POA: Diagnosis not present

## 2024-03-06 LAB — ECHOCARDIOGRAM COMPLETE
AR max vel: 2.9 cm2
AV Area VTI: 3.03 cm2
AV Area mean vel: 2.68 cm2
AV Mean grad: 3 mmHg
AV Peak grad: 5.5 mmHg
Ao pk vel: 1.17 m/s
Area-P 1/2: 2.94 cm2
Height: 76 in
MV VTI: 1.95 cm2
S' Lateral: 3 cm
Weight: 4308.81 [oz_av]

## 2024-03-06 LAB — GLUCOSE, CAPILLARY
Glucose-Capillary: 94 mg/dL (ref 70–99)
Glucose-Capillary: 93 mg/dL (ref 70–99)
Glucose-Capillary: 103 mg/dL — ABNORMAL HIGH (ref 70–99)

## 2024-03-06 LAB — HEMOGLOBIN A1C
Hgb A1c MFr Bld: 5.7 % — ABNORMAL HIGH (ref 4.8–5.6)
Mean Plasma Glucose: 116.89 mg/dL

## 2024-03-06 MED ORDER — MECLIZINE HCL 25 MG PO TABS
25.0000 mg | ORAL_TABLET | Freq: Three times a day (TID) | ORAL | Status: DC | PRN
Start: 2024-03-06 — End: 2024-03-10
  Administered 2024-03-07 – 2024-03-09 (×3): 25 mg via ORAL
  Filled 2024-03-06 (×4): qty 1

## 2024-03-06 MED ORDER — TRAMADOL HCL 50 MG PO TABS
100.0000 mg | ORAL_TABLET | Freq: Four times a day (QID) | ORAL | Status: DC | PRN
  Administered 2024-03-06 – 2024-03-08 (×4): 100 mg via ORAL
  Filled 2024-03-06 (×4): qty 2

## 2024-03-06 MED ORDER — HYDROMORPHONE HCL 1 MG/ML IJ SOLN
0.5000 mg | Freq: Once | INTRAMUSCULAR | Status: AC
  Administered 2024-03-06: 0.5 mg via INTRAVENOUS
  Filled 2024-03-06: qty 0.5

## 2024-03-06 MED ORDER — DIAZEPAM 5 MG PO TABS
5.0000 mg | ORAL_TABLET | Freq: Once | ORAL | Status: AC
  Administered 2024-03-06: 5 mg via ORAL
  Filled 2024-03-06: qty 1

## 2024-03-06 MED ORDER — ACETAMINOPHEN 500 MG PO TABS
1000.0000 mg | ORAL_TABLET | Freq: Four times a day (QID) | ORAL | Status: DC
  Administered 2024-03-06 – 2024-03-10 (×14): 1000 mg via ORAL
  Filled 2024-03-06 (×15): qty 2

## 2024-03-06 MED ORDER — POLYETHYLENE GLYCOL 3350 17 G PO PACK
17.0000 g | PACK | Freq: Every day | ORAL | Status: DC
  Administered 2024-03-07 – 2024-03-08 (×2): 17 g via ORAL
  Filled 2024-03-06 (×4): qty 1

## 2024-03-06 NOTE — Progress Notes (Signed)
 Attending Progress Note  History: Kennan C Saine is here for lumbar decompression, they are currently 4 Days Post-Op.   POD4: Pt experienced significant back pain this morning and continues to complain of dizziness when ambulating  POD3: Had difficulty with orthostasis and dizziness yesterday, was unable to walk to bathroom safely. POD2: Got dizzy yesterday with PTOT.  Feeling better today. POD1: Doing well.  Midline back pain but feels like his legs feel better already.  Physical Exam: Vitals:   03/06/24 0354 03/06/24 0736  BP: 112/76 127/73  Pulse: 76 82  Resp: 17 18  Temp: 99.3 F (37.4 C) 99.7 F (37.6 C)  SpO2: 98% 93%    AA Ox3 CNI  able to stand with PT   Dressing with some drainage on it this morning. Incision c/d/I with not active drainage noted. There is a small fluid collection beneath the skin  Data:  Other tests/results: No new testing  Assessment/Plan:  Leng C Florido has had continued dizziness and orthostasis after surgery.  Echo pending  - Drains: Removed - mobilize - pain control - DVT prophylaxis - IM consulted and following. We appreciate your assistance.  - PTOT, plans for discharge after PT OT evaluation if appropriate  Edsel Goods PA-C Department of Neurosurgery

## 2024-03-06 NOTE — Plan of Care (Signed)
  Problem: Coping: Goal: Ability to adjust to condition or change in health will improve Outcome: Progressing   Problem: Fluid Volume: Goal: Ability to maintain a balanced intake and output will improve Outcome: Progressing   Problem: Metabolic: Goal: Ability to maintain appropriate glucose levels will improve Outcome: Progressing   Problem: Bowel/Gastric: Goal: Gastrointestinal status for postoperative course will improve Outcome: Progressing   Problem: Skin Integrity: Goal: Will show signs of wound healing Outcome: Progressing

## 2024-03-06 NOTE — Progress Notes (Addendum)
 Physical Therapy Treatment Patient Details Name: Robert Maldonado MRN: [id number] DOB: 1965/08/28 Today's Date: 03/06/2024   History of Present Illness Robert Maldonado is a 58yoM who presents to [facility] 03/02/24 for elective Left L3-S1 laminectomy in the setting of lumbar spinal stenosis, myelopathy, LLE pain, weakness, myoclonus. PMH: ACDF.    PT Comments  Co-tx with OT.  Pt motivated for session but remains quite limited by dizziness, pain and general malaise.  He is able to get to EOB with bed features and inc time.  Pt stands x 3 during session and is able to transfer to/from chair during session but limited time up due to feeling of nausea and some dry heaving.  Stated he feels it in my throat and reported having some heart burn but no chest pain.  He is able to take a few steps forward and back with walker and +2 assist limited by dizziness and pain.  Pt vocalized being generally frustrated and does seem to shut down at and of session stating he has no further needs.  Pt in bed crying.  Support given.  Danielle - neurosurgery PA in for most of session to observe and aware of barriers.  BP response feels more stable today documented in flow sheets.  Given lack of progress with mobility and response to activity, will adjust discharge recommendations to SNF as pt has not met goals for a safe discharge.  Team aware.   If plan is discharge home, recommend the following: A little help with bathing/dressing/bathroom;Assistance with cooking/housework;Direct supervision/assist for medications management;Assist for transportation;Help with stairs or ramp for entrance;Two people to help with walking and/or transfers   Can travel by private vehicle        Equipment Recommendations  Rolling walker (2 wheels)    Recommendations for Other Services       Precautions / Restrictions Precautions Precautions: Fall;Back Recall of Precautions/Restrictions: Intact Precaution/Restrictions Comments: no brace  needed Restrictions Weight Bearing Restrictions Per Provider Order: No Other Position/Activity Restrictions: BP's/dizziness     Mobility  Bed Mobility Overal bed mobility: Modified Independent Bed Mobility: Supine to Sit, Sit to Supine Rolling: Used rails   Supine to sit: Used rails Sit to supine: Used rails     Patient Response: Cooperative  Transfers Overall transfer level: Needs assistance Equipment used: Rolling walker (2 wheels) Transfers: Sit to/from Stand Sit to Stand: +2 safety/equipment, Min assist, Mod assist                Ambulation/Gait Ambulation/Gait assistance: Contact guard assist, +2 safety/equipment Gait Distance (Feet): 4 Feet Assistive device: Rolling walker (2 wheels)   Gait velocity: decreased     General Gait Details: few feet forward and back.  limited by pain and dizziness but BP does not present as overly orthostatic   Stairs             Wheelchair Mobility     Tilt Bed Tilt Bed Patient Response: Cooperative  Modified Rankin (Stroke Patients Only)       Balance Overall balance assessment: Needs assistance Sitting-balance support: Feet supported Sitting balance-Leahy Scale: Good     Standing balance support: Bilateral upper extremity supported, During functional activity, Reliant on assistive device for balance Standing balance-Leahy Scale: Fair Standing balance comment: high fall risk due to concerns of dizziness                            Communication Communication Communication: No apparent difficulties  Cognition Arousal: Alert Behavior During Therapy: WFL for tasks assessed/performed   PT - Cognitive impairments: No apparent impairments                       PT - Cognition Comments: Pt is A and O x 4 Following commands: Intact      Cueing Cueing Techniques: Verbal cues, Tactile cues  Exercises      General Comments        Pertinent Vitals/Pain Pain Assessment Pain  Assessment: Faces Faces Pain Scale: Hurts whole lot Pain Location: back Pain Descriptors / Indicators: Discomfort, Grimacing, Guarding Pain Intervention(s): Limited activity within patient's tolerance, Monitored during session, Repositioned    Home Living                          Prior Function            PT Goals (current goals can now be found in the care plan section) Progress towards PT goals: Not progressing toward goals - comment    Frequency    Min 3X/week      PT Plan      Co-evaluation PT/OT/SLP Co-Evaluation/Treatment: Yes Reason for Co-Treatment: For patient/therapist safety;To address functional/ADL transfers PT goals addressed during session: Mobility/safety with mobility OT goals addressed during session: Proper use of Adaptive equipment and DME      AM-PAC PT 6 Clicks Mobility   Outcome Measure  Help needed turning from your back to your side while in a flat bed without using bedrails?: A Little Help needed moving from lying on your back to sitting on the side of a flat bed without using bedrails?: A Little Help needed moving to and from a bed to a chair (including a wheelchair)?: A Lot Help needed standing up from a chair using your arms (e.g., wheelchair or bedside chair)?: A Little Help needed to walk in hospital room?: A Little Help needed climbing 3-5 steps with a railing? : A Lot 6 Click Score: 16    End of Session   Activity Tolerance: Treatment limited secondary to medical complications (Comment) Patient left: with call bell/phone within reach;in bed;with bed alarm set Nurse Communication: Mobility status PT Visit Diagnosis: Difficulty in walking, not elsewhere classified (R26.2);Other abnormalities of gait and mobility (R26.89);Other symptoms and signs involving the nervous system (R29.898);Dizziness and giddiness (R42);Unsteadiness on feet (R26.81);Muscle weakness (generalized) (M62.81)     Time: 9167-9087 PT Time Calculation  (min) (ACUTE ONLY): 40 min  Charges:    $Gait Training: 8-22 mins $Therapeutic Activity: 8-22 mins PT General Charges $$ ACUTE PT VISIT: 1 Visit                   Lauraine Gills, PTA 03/06/24, 9:24 AM

## 2024-03-06 NOTE — Progress Notes (Signed)
*  PRELIMINARY RESULTS* Echocardiogram 2D Echocardiogram has been performed.  Robert Maldonado 03/06/2024, 9:40 AM

## 2024-03-06 NOTE — Progress Notes (Addendum)
 PROGRESS NOTE  Robert Maldonado  FMW:[id number] DOB: 1965/09/18 DOA: 03/02/2024 PCP: Cleotilde Oneil FALCON, MD   Brief Narrative: Patient is a 58 year old male with history of depression, peripheral neuropathy, prediabetes, rheumatoid arthritis, lumbar stenosis who was admitted on 11/20 under neurosurgery service for lumbar laminectomy, medial facetectomy for lateral recess decompression L3-S1.  Postoperatively, patient became diaphoretic, dizzy upon standing with physical therapy.  We were consulted for this.  Orthostatic vitals have been negative.  Currently blood pressure stable.  Echo pending.  Assessment & Plan:  Principal Problem:   Lumbar stenosis Active Problems:   Spondylosis, lumbar, with myelopathy   Left leg weakness   Spinal stenosis, lumbar region, with neurogenic claudication  Dizziness/near syncope: Suspected to be from orthostatic hypotension, opioids.  Atenolol  discontinued.  Initially orthostatic vitals were positive.  Orthostatic vitals taken  today on 11/24 have  been negative.  Currently blood pressure stable.  Echo showed EF of 55 to 60%, grade 1 diastolic dysfunction.  No valvular abnormalities.  He looks euvolemic.  Still complains of some dizziness while standing up.Ordered TED hose. If he remains dizzy,we can consider MRI brain. We will also order meclizine  prn  Lumbar stenosis: Status postlaminectomy.  Neurosurgery are the primary.  Management as per neurosurgery.  Complains of  pain on the surgical site. PT/OT following and recommended home health.  CKD stage IIIa: Currently kidney function at baseline  History of depression: On Paxil   Hypertension: Takes atenolol  at home.  Discontinued given her orthostatic hypotension.  Currently blood pressure  stable  Rheumatoid arthritis: Currently stable.  Currently not following with rheumatology  Prediabetes: A1c 5.7.  Currently blood sugars well-controlled  Obesity: BMI of 32.7           DVT prophylaxis:enoxaparin   (LOVENOX ) injection 40 mg Start: 03/03/24 0800 TED hose Start: 03/02/24 1551 SCD's Start: 03/02/24 1551     Code Status: Full Code   Antimicrobials:  Anti-infectives (From admission, onward)    Start     Dose/Rate Route Frequency Ordered Stop   03/02/24 0935  ceFAZolin  (ANCEF ) IVPB 3g/150 mL premix        3 g 300 mL/hr over 30 Minutes Intravenous 30 min pre-op 03/02/24 0935 03/02/24 1058   03/02/24 0930  ceFAZolin  (ANCEF ) IVPB 3g/100 mL premix  Status:  Discontinued        3 g 200 mL/hr over 30 Minutes Intravenous  Once 03/02/24 9071 03/02/24 0935       Subjective: Patient seen and examined at bedside today.  Hemodynamically stable.  Lying in bed.  Complains of back pain on the surgical side.  Complains of some dizziness on standing but blood pressure did not not fall.  Blood pressure has been stable.  Objective: Vitals:   03/06/24 0010 03/06/24 0354 03/06/24 0736 03/06/24 1248  BP: 115/69 112/76 127/73 119/84  Pulse: 76 76 82 78  Resp: 17 17 18 18   Temp: 98.7 F (37.1 C) 99.3 F (37.4 C) 99.7 F (37.6 C) 98.6 F (37 C)  TempSrc:    Oral  SpO2: 99% 98% 93% 100%  Weight:      Height:        Intake/Output Summary (Last 24 hours) at 03/06/2024 1313 Last data filed at 03/06/2024 1200 Gross per 24 hour  Intake 798.48 ml  Output 2600 ml  Net -1801.52 ml   Filed Weights   03/02/24 1413  Weight: 122.2 kg    Examination:  General exam: Overall comfortable, not in distress,obese HEENT: PERRL Respiratory system:  no wheezes or crackles  Cardiovascular system: S1 & S2 heard, RRR.  Gastrointestinal system: Abdomen is nondistended, soft and nontender. Central nervous system: Alert and oriented Extremities: No edema, no clubbing ,no cyanosis, surgical wound covered with dressing on the lower back Skin: No rashes, no ulcers,no icterus     Data Reviewed: I have personally reviewed following labs and imaging studies  CBC: Recent Labs  Lab 03/05/24 1134  WBC 7.9   HGB 13.5  HCT 39.6  MCV 90.0  PLT 182   Basic Metabolic Panel: Recent Labs  Lab 03/05/24 1134  NA 136  K 4.1  CL 100  CO2 25  GLUCOSE 93  BUN 15  CREATININE 1.31*  CALCIUM  9.3     No results found for this or any previous visit (from the past 240 hours).   Radiology Studies: No results found.  Scheduled Meds:  amitriptyline   50 mg Oral QHS   cholecalciferol   1,000 Units Oral BID   clotrimazole    Topical BID   cyanocobalamin   1,000 mcg Oral BID   docusate sodium   100 mg Oral BID   empagliflozin   10 mg Oral Daily   enoxaparin  (LOVENOX ) injection  40 mg Subcutaneous Q24H   gabapentin   1,600 mg Oral TID   pantoprazole   40 mg Oral Daily   PARoxetine   20 mg Oral Daily   scopolamine   1 patch Transdermal Q72H   senna  1 tablet Oral BID   Continuous Infusions:   LOS: 2 days   Ivonne Mustache, MD [facility] P11/24/2025, 1:13 PM

## 2024-03-06 NOTE — Progress Notes (Signed)
 Afternoon rounds on [name] were performed.  I spoke with him and his daughter who is at bedside.  We did a medical/medicine review and saw that he was on a scopolamine  patch for postoperative nausea vomiting, however he did not tolerate this well at his last admission.  Will plan to discontinue the scopolamine  and increase his pain regimen as he is currently only on his baseline pain regimen from prior to surgery.  This was minimized given his orthostasis and dizziness early postop.   Penne MICAEL Sharps, MD

## 2024-03-06 NOTE — Plan of Care (Signed)
  Problem: Education: Goal: Ability to describe self-care measures that may prevent or decrease complications (Diabetes Survival Skills Education) will improve Outcome: Progressing Goal: Individualized Educational Video(s) Outcome: Progressing   Problem: Coping: Goal: Ability to adjust to condition or change in health will improve Outcome: Progressing   Problem: Fluid Volume: Goal: Ability to maintain a balanced intake and output will improve Outcome: Progressing   Problem: Health Behavior/Discharge Planning: Goal: Ability to identify and utilize available resources and services will improve Outcome: Progressing Goal: Ability to manage health-related needs will improve Outcome: Progressing   Problem: Metabolic: Goal: Ability to maintain appropriate glucose levels will improve Outcome: Progressing   Problem: Nutritional: Goal: Maintenance of adequate nutrition will improve Outcome: Progressing Goal: Progress toward achieving an optimal weight will improve Outcome: Progressing   Problem: Skin Integrity: Goal: Risk for impaired skin integrity will decrease Outcome: Progressing   Problem: Tissue Perfusion: Goal: Adequacy of tissue perfusion will improve Outcome: Progressing   Problem: Education: Goal: Ability to verbalize activity precautions or restrictions will improve Outcome: Progressing Goal: Knowledge of the prescribed therapeutic regimen will improve Outcome: Progressing Goal: Understanding of discharge needs will improve Outcome: Progressing   Problem: Activity: Goal: Ability to avoid complications of mobility impairment will improve Outcome: Progressing Goal: Ability to tolerate increased activity will improve Outcome: Progressing Goal: Will remain free from falls Outcome: Progressing   Problem: Bowel/Gastric: Goal: Gastrointestinal status for postoperative course will improve Outcome: Progressing   Problem: Clinical Measurements: Goal: Ability to  maintain clinical measurements within normal limits will improve Outcome: Progressing Goal: Postoperative complications will be avoided or minimized Outcome: Progressing Goal: Diagnostic test results will improve Outcome: Progressing   Problem: Pain Management: Goal: Pain level will decrease Outcome: Progressing   Problem: Skin Integrity: Goal: Will show signs of wound healing Outcome: Progressing   Problem: Health Behavior/Discharge Planning: Goal: Identification of resources available to assist in meeting health care needs will improve Outcome: Progressing   Problem: Bladder/Genitourinary: Goal: Urinary functional status for postoperative course will improve Outcome: Progressing   Problem: Education: Goal: Knowledge of General Education information will improve Description: Including pain rating scale, medication(s)/side effects and non-pharmacologic comfort measures Outcome: Progressing   Problem: Health Behavior/Discharge Planning: Goal: Ability to manage health-related needs will improve Outcome: Progressing   Problem: Clinical Measurements: Goal: Ability to maintain clinical measurements within normal limits will improve Outcome: Progressing Goal: Will remain free from infection Outcome: Progressing Goal: Diagnostic test results will improve Outcome: Progressing Goal: Respiratory complications will improve Outcome: Progressing Goal: Cardiovascular complication will be avoided Outcome: Progressing   Problem: Activity: Goal: Risk for activity intolerance will decrease Outcome: Progressing   Problem: Nutrition: Goal: Adequate nutrition will be maintained Outcome: Progressing   Problem: Coping: Goal: Level of anxiety will decrease Outcome: Progressing   Problem: Elimination: Goal: Will not experience complications related to bowel motility Outcome: Progressing Goal: Will not experience complications related to urinary retention Outcome: Progressing    Problem: Pain Managment: Goal: General experience of comfort will improve and/or be controlled Outcome: Progressing   Problem: Safety: Goal: Ability to remain free from injury will improve Outcome: Progressing   Problem: Skin Integrity: Goal: Risk for impaired skin integrity will decrease Outcome: Progressing

## 2024-03-06 NOTE — Progress Notes (Signed)
 Occupational Therapy Treatment Patient Details Name: Robert Maldonado MRN: [id number] DOB: [birth year] Today's Date: 03/06/2024   History of present illness Philippe Rheaume is a 58yoM who presents to [facility] 03/02/24 for elective Left L3-S1 laminectomy in the setting of lumbar spinal stenosis, myelopathy, LLE pain, weakness, myoclonus. PMH: ACDF.   OT comments  Patient seen for OT/PT cotx. Upon arrival to room patient resting in bed, agreeable to treatment. Patient remained motivated throughout tx but was limited by dizziness and increased pain. Danielle, neurosurgery PA, present during visit and could observe patients mobility and discussed with patient regarding his barriers/concerns. Patient emotional throughout visit due to pain and his concerns for his lack of progress. OT discussed home set up and ADLs which patient reports his wife will be able to assist. Patient became nauseated after transferring to recliner but did not vomit, returned to bed. RN notified of patient tearful, nauseous and increased pain.  Patient ended treatment in bed with bed/chair alarm on and all needs within reach. Patient making limited progress toward goals, will continue to follow POC. Discharge recommendation remains appropriate.        If plan is discharge home, recommend the following:  A little help with bathing/dressing/bathroom;A lot of help with walking and/or transfers;Help with stairs or ramp for entrance   Equipment Recommendations  None recommended by OT    Recommendations for Other Services      Precautions / Restrictions Precautions Precautions: Fall;Back Recall of Precautions/Restrictions: Intact Precaution/Restrictions Comments: no brace needed Restrictions Weight Bearing Restrictions Per Provider Order: No Other Position/Activity Restrictions: BP's/dizziness       Mobility Bed Mobility Overal bed mobility: Modified Independent Bed Mobility: Supine to Sit, Sit to Supine Rolling: Used  rails Sidelying to sit: Supervision, HOB elevated Supine to sit: Used rails Sit to supine: Used rails        Transfers Overall transfer level: Needs assistance Equipment used: Rolling walker (2 wheels) Transfers: Sit to/from Stand Sit to Stand: +2 safety/equipment, Min assist, Mod assist           General transfer comment: Pt endorses severe dizziness and becomes diaphramatic. Unable to stand for > 20 sec before needing to sit due to concerns of syncopal episode     Balance                                           ADL either performed or assessed with clinical judgement   ADL                                       Functional mobility during ADLs: Moderate assistance;Rolling walker (2 wheels) General ADL Comments: mod A x 2 for mobility, very limited due to pain    Extremity/Trunk Assessment              Vision       Perception     Praxis     Communication Communication Communication: No apparent difficulties   Cognition Arousal: Alert Behavior During Therapy: WFL for tasks assessed/performed Cognition: No apparent impairments                               Following commands: Intact        Cueing  Cueing Techniques: Verbal cues, Tactile cues  Exercises Other Exercises Other Exercises: educated on spinal precautions in relation to ADLs, started to discuss AE he could use but he reports his wife will be able to assist him    Shoulder Instructions       General Comments      Pertinent Vitals/ Pain       Pain Assessment Pain Assessment: 0-10 Pain Score: 9  Pain Location: back Pain Descriptors / Indicators: Discomfort, Grimacing, Guarding Pain Intervention(s): Monitored during session, Premedicated before session, Limited activity within patient's tolerance  Home Living                                          Prior Functioning/Environment              Frequency  Min  3X/week        Progress Toward Goals  OT Goals(current goals can now be found in the care plan section)  Progress towards OT goals: Not progressing toward goals - comment (very limited by pain)  Acute Rehab OT Goals Patient Stated Goal: to go home OT Goal Formulation: With patient Time For Goal Achievement: 03/17/24 Potential to Achieve Goals: Good ADL Goals Pt Will Perform Grooming: with modified independence;sitting;standing Pt Will Perform Lower Body Dressing: with modified independence;sitting/lateral leans;sit to/from stand Pt Will Transfer to Toilet: with modified independence;ambulating Pt Will Perform Toileting - Clothing Manipulation and hygiene: with modified independence;sit to/from stand;sitting/lateral leans  Plan      Co-evaluation      Reason for Co-Treatment: For patient/therapist safety;To address functional/ADL transfers PT goals addressed during session: Mobility/safety with mobility OT goals addressed during session: Proper use of Adaptive equipment and DME      AM-PAC OT 6 Clicks Daily Activity     Outcome Measure   Help from another person eating meals?: None Help from another person taking care of personal grooming?: A Little Help from another person toileting, which includes using toliet, bedpan, or urinal?: A Little Help from another person bathing (including washing, rinsing, drying)?: A Little Help from another person to put on and taking off regular upper body clothing?: A Little Help from another person to put on and taking off regular lower body clothing?: A Little 6 Click Score: 19    End of Session Equipment Utilized During Treatment: Rolling walker (2 wheels)  OT Visit Diagnosis: Other abnormalities of gait and mobility (R26.89)   Activity Tolerance Patient limited by pain   Patient Left in bed;with call bell/phone within reach;with bed alarm set   Nurse Communication Patient requests pain meds;Other (comment) (diaphoretic,  nauseated)        Time: 9165-9087 OT Time Calculation (min): 38 min  Charges: OT General Charges $OT Visit: 1 Visit OT Treatments $Self Care/Home Management : 23-37 mins  Rogers Clause, OT/L MSOT, 03/06/2024

## 2024-03-07 DIAGNOSIS — M48061 Spinal stenosis, lumbar region without neurogenic claudication: Secondary | ICD-10-CM | POA: Diagnosis not present

## 2024-03-07 LAB — GLUCOSE, CAPILLARY
Glucose-Capillary: 163 mg/dL — ABNORMAL HIGH (ref 70–99)
Glucose-Capillary: 156 mg/dL — ABNORMAL HIGH (ref 70–99)
Glucose-Capillary: 110 mg/dL — ABNORMAL HIGH (ref 70–99)
Glucose-Capillary: 140 mg/dL — ABNORMAL HIGH (ref 70–99)

## 2024-03-07 MED ORDER — HYDROMORPHONE HCL 2 MG PO TABS
1.0000 mg | ORAL_TABLET | ORAL | Status: DC | PRN
  Administered 2024-03-07 – 2024-03-10 (×11): 1 mg via ORAL
  Filled 2024-03-07 (×12): qty 1

## 2024-03-07 NOTE — Progress Notes (Signed)
 Attending Progress Note  History: Robert Maldonado is here for lumbar decompression, they are currently 5 Days Post-Op.   POD5: Pt resting comfortably in bed this morning. Reporting better pain control overnight  POD4: Pt experienced significant back pain this morning and continues to complain of dizziness when ambulating  POD3: Had difficulty with orthostasis and dizziness yesterday, was unable to walk to bathroom safely. POD2: Got dizzy yesterday with PTOT.  Feeling better today. POD1: Doing well.  Midline back pain but feels like his legs feel better already.  Physical Exam: Vitals:   03/06/24 2100 03/07/24 0518  BP: 125/68 118/67  Pulse: 75 77  Resp: 18 18  Temp: 98.4 F (36.9 C) 98.4 F (36.9 C)  SpO2: 100% 100%    AA Ox3 CNI  able to stand with PT   Dressing with some drainage on it this morning. Incision c/d/I with not active drainage noted. There is a small fluid collection beneath the skin  Data:  Other tests/results: No new testing  Assessment/Plan:  Robert Maldonado has had continued dizziness and orthostasis after surgery.  Echo pending  - Drains: Removed - mobilize - pain control - DVT prophylaxis - IM consulted and following. We appreciate your assistance.  - PTOT, working on possible SNF placement   Edsel Goods PA-C Department of Neurosurgery

## 2024-03-07 NOTE — Plan of Care (Signed)
  Problem: Coping: Goal: Ability to adjust to condition or change in health will improve Outcome: Progressing   Problem: Metabolic: Goal: Ability to maintain appropriate glucose levels will improve Outcome: Progressing   Problem: Activity: Goal: Will remain free from falls Outcome: Progressing   Problem: Bowel/Gastric: Goal: Gastrointestinal status for postoperative course will improve Outcome: Progressing   Problem: Bladder/Genitourinary: Goal: Urinary functional status for postoperative course will improve Outcome: Progressing

## 2024-03-07 NOTE — Hospital Course (Addendum)
 Patient is a 58 year old male with history of depression, peripheral neuropathy, prediabetes, rheumatoid arthritis, lumbar stenosis who was admitted on 11/20 under neurosurgery service for lumbar laminectomy, medial facetectomy for lateral recess decompression L3-S1.  Postoperatively, patient became diaphoretic, dizzy upon standing with physical therapy.  We were consulted for this.  Orthostatic vitals have been negative.  Currently blood pressure stable.  Echo normal EF.

## 2024-03-07 NOTE — Plan of Care (Signed)
  Problem: Education: Goal: Ability to describe self-care measures that may prevent or decrease complications (Diabetes Survival Skills Education) will improve Outcome: Progressing   Problem: Nutritional: Goal: Maintenance of adequate nutrition will improve Outcome: Progressing   Problem: Metabolic: Goal: Ability to maintain appropriate glucose levels will improve Outcome: Progressing   Problem: Skin Integrity: Goal: Risk for impaired skin integrity will decrease Outcome: Progressing

## 2024-03-07 NOTE — Progress Notes (Signed)
  Progress Note   Patient: Robert Maldonado DOB: 12/18/1965 DOA: 03/02/2024     3 DOS: the patient was seen and examined on 03/07/2024   Brief hospital course: Patient is a 58 year old male with history of depression, peripheral neuropathy, prediabetes, rheumatoid arthritis, lumbar stenosis who was admitted on 11/20 under neurosurgery service for lumbar laminectomy, medial facetectomy for lateral recess decompression L3-S1.  Postoperatively, patient became diaphoretic, dizzy upon standing with physical therapy.  We were consulted for this.  Orthostatic vitals have been negative.  Currently blood pressure stable.  Echo normal EF.   Assessment and Plan:  Dizziness/near syncope:  Suspected to be from orthostatic hypotension, opioids.  Atenolol  discontinued 11/24.  Initially orthostatic vitals were positive.  Orthostatic vitals taken   on 11/24 have  been negative. 11/25: still has some orthostatics.  Currently blood pressure low normal.  Echo showed EF of 55 to 60%, grade 1 diastolic dysfunction.  No valvular abnormalities.  He looks euvolemic.  Still complains of some dizziness while standing up.Ordered TED hose. If he remains dizzy,we can consider MRI brain. We will continue meclizine  prn   Lumbar stenosis:  Status postlaminectomy.   Neurosurgery are the primary.   Management as per neurosurgery.   Complains of  pain on the surgical site.  PT/OT following and recommended home health.   CKD stage IIIa: Currently kidney function at baseline   History of depression: On Paxil    Hypertension: Takes atenolol  at home.  Discontinued given her orthostatic hypotension.  Currently blood pressure  stable   Rheumatoid arthritis: Currently stable.  Currently not following with rheumatology   Prediabetes: A1c 5.7.  Currently blood sugars well-controlled      Subjective: Still complains of intermittent dizziness vertigo  Physical Exam: Vitals:   03/07/24 1030 03/07/24 1032 03/07/24  1035 03/07/24 1037  BP: 120/85 107/77 (!) 169/107 121/85  Pulse: 82 94 88 84  Resp:      Temp:      TempSrc:      SpO2: 97% 95% 99%   Weight:      Height:       Alert awake and oriented Neck is supple Lungs are clear to auscultation Heart regular S1-S2 Abdomen soft bowel sounds present obese abdomen Extremities: Grossly decreased power to  all upper and lower extremities  Data Reviewed:  Results reviewed  Family Communication: None   Disposition: Status is: Inpatient Remains inpatient appropriate because: Ongoing recovery from bilateral lower extremity weakness postsurgery surgery  Planned Discharge Destination: Rehab    Time spent: 35 minutes  Author: Milani Lowenstein, MD 03/07/2024 1:16 PM  For on call review [email or web address].

## 2024-03-07 NOTE — Plan of Care (Signed)
  Problem: Coping: Goal: Ability to adjust to condition or change in health will improve Outcome: Progressing   Problem: Health Behavior/Discharge Planning: Goal: Ability to identify and utilize available resources and services will improve Outcome: Progressing Goal: Ability to manage health-related needs will improve Outcome: Progressing   Problem: Metabolic: Goal: Ability to maintain appropriate glucose levels will improve Outcome: Progressing   Problem: Skin Integrity: Goal: Risk for impaired skin integrity will decrease Outcome: Progressing   Problem: Tissue Perfusion: Goal: Adequacy of tissue perfusion will improve Outcome: Progressing   Problem: Education: Goal: Ability to verbalize activity precautions or restrictions will improve Outcome: Progressing Goal: Knowledge of the prescribed therapeutic regimen will improve Outcome: Progressing   Problem: Activity: Goal: Ability to avoid complications of mobility impairment will improve Outcome: Progressing Goal: Ability to tolerate increased activity will improve Outcome: Progressing Goal: Will remain free from falls Outcome: Progressing   Problem: Bowel/Gastric: Goal: Gastrointestinal status for postoperative course will improve Outcome: Progressing   Problem: Clinical Measurements: Goal: Ability to maintain clinical measurements within normal limits will improve Outcome: Progressing Goal: Diagnostic test results will improve Outcome: Progressing   Problem: Pain Management: Goal: Pain level will decrease Outcome: Progressing   Problem: Skin Integrity: Goal: Will show signs of wound healing Outcome: Progressing   Problem: Bladder/Genitourinary: Goal: Urinary functional status for postoperative course will improve Outcome: Progressing   Problem: Education: Goal: Knowledge of General Education information will improve Description: Including pain rating scale, medication(s)/side effects and non-pharmacologic  comfort measures Outcome: Progressing   Problem: Clinical Measurements: Goal: Ability to maintain clinical measurements within normal limits will improve Outcome: Progressing Goal: Will remain free from infection Outcome: Progressing Goal: Diagnostic test results will improve Outcome: Progressing Goal: Respiratory complications will improve Outcome: Progressing Goal: Cardiovascular complication will be avoided Outcome: Progressing   Problem: Activity: Goal: Risk for activity intolerance will decrease Outcome: Progressing   Problem: Nutrition: Goal: Adequate nutrition will be maintained Outcome: Progressing   Problem: Elimination: Goal: Will not experience complications related to bowel motility Outcome: Progressing Goal: Will not experience complications related to urinary retention Outcome: Progressing   Problem: Pain Managment: Goal: General experience of comfort will improve and/or be controlled Outcome: Progressing   Problem: Safety: Goal: Ability to remain free from injury will improve Outcome: Progressing   Problem: Skin Integrity: Goal: Risk for impaired skin integrity will decrease Outcome: Progressing

## 2024-03-07 NOTE — Care Management Important Message (Signed)
 Important Message  Patient Details  Name: Robert Maldonado MRN: [id number] Date of Birth: 1965-06-05   Important Message Given:  Yes - Medicare IM     Xochilt Conant W, CMA 03/07/2024, 3:07 PM

## 2024-03-07 NOTE — Progress Notes (Signed)
 Physical Therapy Treatment Patient Details Name: Robert Maldonado MRN: [id number] DOB: [birth year] Today's Date: 03/07/2024   History of Present Illness Robert Maldonado is a 58yoM who presents to [facility] 03/02/24 for elective Left L3-S1 laminectomy in the setting of lumbar spinal stenosis, myelopathy, LLE pain, weakness, myoclonus. PMH: ACDF.    PT Comments  Pt feeling slightly better today.  He gets to EOB with bed features and poor log roll technique but does not stop to correct.  Some dizziness upon sitting.  He is able to stand today from elevated bed with min a x 2.  Once up he does progress gait 12' with min a x 2 and recliner follow for safety.  Gait remains unsteady and high fall risk given pain, balance and continued dizziness.  Pt asks for emesis bag but does not use.  Less diaphoretic today but does report feeling hot upon return to room.  Did not feel he could safely walk further but does remain up in chair today after session.  OT remains in room for further interventions.  Discussed discharge plan.  Pt agrees he would not be safe or successful at home at this time but remains resistant to rehab.  Discussed with team.   If plan is discharge home, recommend the following: A little help with bathing/dressing/bathroom;Assistance with cooking/housework;Direct supervision/assist for medications management;Assist for transportation;Help with stairs or ramp for entrance;Two people to help with walking and/or transfers   Can travel by private vehicle        Equipment Recommendations  Rolling walker (2 wheels)    Recommendations for Other Services       Precautions / Restrictions Precautions Precautions: Fall;Back Recall of Precautions/Restrictions: Intact Precaution/Restrictions Comments: no brace needed Restrictions Weight Bearing Restrictions Per Provider Order: No Other Position/Activity Restrictions: BP's/dizziness     Mobility  Bed Mobility Overal bed mobility: Needs Assistance Bed  Mobility: Supine to Sit   Sidelying to sit: Supervision, HOB elevated, Used rails       General bed mobility comments: poor rolling technique Patient Response: Cooperative  Transfers Overall transfer level: Needs assistance Equipment used: Rolling walker (2 wheels) Transfers: Sit to/from Stand Sit to Stand: +2 physical assistance, Min assist                Ambulation/Gait Ambulation/Gait assistance: Min assist, +2 physical assistance Gait Distance (Feet): 12 Feet Assistive device: Rolling walker (2 wheels) Gait Pattern/deviations: Decreased step length - right, Decreased step length - left, Decreased dorsiflexion - right, Decreased dorsiflexion - left, Decreased stride length Gait velocity: decreased     General Gait Details: 78' with min a x 2 and recliner follow for safety as he does fatigue/dizzy and needs to sit quickly   Stairs             Wheelchair Mobility     Tilt Bed Tilt Bed Patient Response: Cooperative  Modified Rankin (Stroke Patients Only)       Balance Overall balance assessment: Needs assistance Sitting-balance support: Feet supported Sitting balance-Leahy Scale: Good     Standing balance support: Bilateral upper extremity supported, During functional activity, Reliant on assistive device for balance Standing balance-Leahy Scale: Fair Standing balance comment: high fall risk due to concerns of dizziness                            Communication Communication Communication: No apparent difficulties  Cognition Arousal: Alert Behavior During Therapy: WFL for tasks assessed/performed   PT -  Cognitive impairments: No apparent impairments                       PT - Cognition Comments: Pt is A and O x 4 Following commands: Intact      Cueing Cueing Techniques: Verbal cues, Tactile cues  Exercises      General Comments        Pertinent Vitals/Pain Pain Assessment Pain Assessment: Faces Faces Pain Scale:  Hurts whole lot Pain Location: back Pain Descriptors / Indicators: Discomfort, Grimacing, Guarding Pain Intervention(s): Limited activity within patient's tolerance, Monitored during session, Premedicated before session, Repositioned    Home Living                          Prior Function            PT Goals (current goals can now be found in the care plan section) Progress towards PT goals: Progressing toward goals    Frequency    7X/week      PT Plan      Co-evaluation PT/OT/SLP Co-Evaluation/Treatment: Yes Reason for Co-Treatment: For patient/therapist safety;To address functional/ADL transfers PT goals addressed during session: Mobility/safety with mobility OT goals addressed during session: Proper use of Adaptive equipment and DME      AM-PAC PT 6 Clicks Mobility   Outcome Measure  Help needed turning from your back to your side while in a flat bed without using bedrails?: A Little Help needed moving from lying on your back to sitting on the side of a flat bed without using bedrails?: A Little Help needed moving to and from a bed to a chair (including a wheelchair)?: A Lot Help needed standing up from a chair using your arms (e.g., wheelchair or bedside chair)?: A Little Help needed to walk in hospital room?: A Lot Help needed climbing 3-5 steps with a railing? : Total 6 Click Score: 14    End of Session Equipment Utilized During Treatment: Gait belt Activity Tolerance: Treatment limited secondary to medical complications (Comment) Patient left: with call bell/phone within reach;in chair;with chair alarm set Nurse Communication: Mobility status PT Visit Diagnosis: Difficulty in walking, not elsewhere classified (R26.2);Other abnormalities of gait and mobility (R26.89);Other symptoms and signs involving the nervous system (R29.898);Dizziness and giddiness (R42);Unsteadiness on feet (R26.81);Muscle weakness (generalized) (M62.81)     Time:  9052-8992 PT Time Calculation (min) (ACUTE ONLY): 20 min  Charges:    $Gait Training: 8-22 mins PT General Charges $$ ACUTE PT VISIT: 1 Visit                   Lauraine Gills, PTA 03/07/24, 10:14 AM

## 2024-03-07 NOTE — Progress Notes (Signed)
 Occupational Therapy Treatment Patient Details Name: Robert Maldonado MRN: [id number] DOB: 1965-12-05 Today's Date: 03/07/2024   History of present illness Robert Maldonado is a 58yoM who presents to [facility] 03/02/24 for elective Left L3-S1 laminectomy in the setting of lumbar spinal stenosis, myelopathy, LLE pain, weakness, myoclonus. PMH: ACDF.   OT comments  Patient seen for PT/OT treatment on this date. Upon arrival to room patient resting in bed, pain 5/10 at rest, agreeable to treatment. Patient required cues for proper log rolling technique to exit bed; performed sit<>stand with min A x 2 and r/w, ambulated ~10 feet with r/w with min A x 2 and close chair follow; felt nauseated but did not vomit. Less diaphoretic today. Transitioned to seated in recliner with min A x 2. In recliner, OT instructed on UB mobility HEP as patient with significant WB thru BUE on r/w during mobility, see below for therex, patient with minimal tolerance of HEP reporting pain all the way from L shoulder to L foot with simple mobility (shoulder circles/neck flexion).  Patient was able to brush his teeth while in recliner with set up A. Patient ended treatment in recliner with bed/chair alarm on and all needs within reach. Patient making good progress toward goals, will continue to follow POC. Discharge recommendation remains appropriate.        If plan is discharge home, recommend the following:  A little help with bathing/dressing/bathroom;A lot of help with walking and/or transfers;Help with stairs or ramp for entrance   Equipment Recommendations  None recommended by OT    Recommendations for Other Services      Precautions / Restrictions Precautions Precautions: Fall;Back Recall of Precautions/Restrictions: Intact Precaution/Restrictions Comments: no brace needed Restrictions Weight Bearing Restrictions Per Provider Order: No Other Position/Activity Restrictions: BP's/dizziness       Mobility Bed  Mobility Overal bed mobility: Needs Assistance Bed Mobility: Supine to Sit Rolling: Used rails Sidelying to sit: Supervision, HOB elevated, Used rails Supine to sit: Used rails     General bed mobility comments: cues to implement correct technique/poor mechanics    Transfers Overall transfer level: Needs assistance Equipment used: Rolling walker (2 wheels) Transfers: Sit to/from Stand Sit to Stand: +2 physical assistance, Min assist                 Balance Overall balance assessment: Needs assistance Sitting-balance support: Feet supported Sitting balance-Leahy Scale: Good     Standing balance support: Bilateral upper extremity supported, During functional activity, Reliant on assistive device for balance Standing balance-Leahy Scale: Fair Standing balance comment: high fall risk due to concerns of dizziness                           ADL either performed or assessed with clinical judgement   ADL Overall ADL's : Needs assistance/impaired                 Upper Body Dressing : Set up                          Extremity/Trunk Assessment              Vision       Perception     Praxis     Communication Communication Communication: No apparent difficulties   Cognition Arousal: Alert Behavior During Therapy: [facility] for tasks assessed/performed  Following commands: Intact        Cueing   Cueing Techniques: Verbal cues, Tactile cues  Exercises Exercises: Other exercises Other Exercises Other Exercises: UB mobility HEP to reduce tension/guarding thru B anterior trap; poor tolerance. performed 5 reps of neck flexion/extension, head rotation, shoulder circles and scapular retraction; reports pain all the way down his L side during therex    Shoulder Instructions       General Comments      Pertinent Vitals/ Pain       Pain Assessment Pain Assessment: 0-10 Pain Score: 5  Pain  Location: back Pain Descriptors / Indicators: Discomfort, Grimacing, Guarding Pain Intervention(s): Limited activity within patient's tolerance, Monitored during session, Repositioned, Premedicated before session  Home Living                                          Prior Functioning/Environment              Frequency  Min 3X/week        Progress Toward Goals  OT Goals(current goals can now be found in the care plan section)  Progress towards OT goals: Progressing toward goals  Acute Rehab OT Goals Patient Stated Goal: to go home OT Goal Formulation: With patient Time For Goal Achievement: 03/17/24 Potential to Achieve Goals: Good ADL Goals Pt Will Perform Grooming: with modified independence;sitting;standing Pt Will Perform Lower Body Dressing: with modified independence;sitting/lateral leans;sit to/from stand Pt Will Transfer to Toilet: with modified independence;ambulating Pt Will Perform Toileting - Clothing Manipulation and hygiene: with modified independence;sit to/from stand;sitting/lateral leans  Plan      Co-evaluation      Reason for Co-Treatment: For patient/therapist safety;To address functional/ADL transfers PT goals addressed during session: Mobility/safety with mobility OT goals addressed during session: Proper use of Adaptive equipment and DME      AM-PAC OT 6 Clicks Daily Activity     Outcome Measure   Help from another person eating meals?: None Help from another person taking care of personal grooming?: A Little Help from another person toileting, which includes using toliet, bedpan, or urinal?: A Little Help from another person bathing (including washing, rinsing, drying)?: A Little Help from another person to put on and taking off regular upper body clothing?: A Little Help from another person to put on and taking off regular lower body clothing?: A Little 6 Click Score: 19    End of Session Equipment Utilized During  Treatment: Rolling walker (2 wheels)  OT Visit Diagnosis: Other abnormalities of gait and mobility (R26.89)   Activity Tolerance Patient limited by pain   Patient Left in chair;with call bell/phone within reach   Nurse Communication Mobility status        Time: 9052-8986 OT Time Calculation (min): 26 min  Charges: OT General Charges $OT Visit: 1 Visit OT Treatments $Self Care/Home Management : 8-22 mins $Therapeutic Exercise: 8-22 mins  Rogers Clause, OT/L MSOT, 03/07/2024

## 2024-03-08 ENCOUNTER — Inpatient Hospital Stay

## 2024-03-08 DIAGNOSIS — M48061 Spinal stenosis, lumbar region without neurogenic claudication: Secondary | ICD-10-CM | POA: Diagnosis not present

## 2024-03-08 LAB — GLUCOSE, CAPILLARY
Glucose-Capillary: 130 mg/dL — ABNORMAL HIGH (ref 70–99)
Glucose-Capillary: 133 mg/dL — ABNORMAL HIGH (ref 70–99)
Glucose-Capillary: 121 mg/dL — ABNORMAL HIGH (ref 70–99)
Glucose-Capillary: 108 mg/dL — ABNORMAL HIGH (ref 70–99)

## 2024-03-08 MED ORDER — CALCIUM CARBONATE ANTACID 500 MG PO CHEW
1.0000 | CHEWABLE_TABLET | Freq: Three times a day (TID) | ORAL | Status: DC | PRN
  Administered 2024-03-08: 200 mg via ORAL
  Filled 2024-03-08: qty 1

## 2024-03-08 MED ORDER — METHOCARBAMOL 500 MG PO TABS
750.0000 mg | ORAL_TABLET | Freq: Three times a day (TID) | ORAL | Status: DC
  Administered 2024-03-08 – 2024-03-10 (×8): 750 mg via ORAL
  Filled 2024-03-08 (×8): qty 2

## 2024-03-08 MED ORDER — ENSURE PLUS HIGH PROTEIN PO LIQD
237.0000 mL | Freq: Two times a day (BID) | ORAL | Status: DC
  Administered 2024-03-08: 237 mL via ORAL

## 2024-03-08 MED ORDER — TRAMADOL HCL 50 MG PO TABS
100.0000 mg | ORAL_TABLET | Freq: Four times a day (QID) | ORAL | Status: DC
  Administered 2024-03-08 – 2024-03-10 (×9): 100 mg via ORAL
  Filled 2024-03-08 (×9): qty 2

## 2024-03-08 MED ORDER — HYDROMORPHONE HCL 1 MG/ML IJ SOLN
1.0000 mg | Freq: Once | INTRAMUSCULAR | Status: AC
  Administered 2024-03-08: 1 mg via INTRAVENOUS
  Filled 2024-03-08: qty 1

## 2024-03-08 MED ORDER — DIAZEPAM 5 MG PO TABS
5.0000 mg | ORAL_TABLET | Freq: Three times a day (TID) | ORAL | Status: DC | PRN
  Administered 2024-03-08 – 2024-03-10 (×6): 5 mg via ORAL
  Filled 2024-03-08 (×6): qty 1

## 2024-03-08 MED ORDER — KETOROLAC TROMETHAMINE 15 MG/ML IJ SOLN
7.5000 mg | Freq: Three times a day (TID) | INTRAMUSCULAR | Status: AC
  Administered 2024-03-08 – 2024-03-09 (×3): 7.5 mg via INTRAVENOUS
  Filled 2024-03-08 (×3): qty 1

## 2024-03-08 NOTE — Plan of Care (Signed)
  Problem: Nutritional: Goal: Maintenance of adequate nutrition will improve Outcome: Progressing   Problem: Activity: Goal: Ability to tolerate increased activity will improve Outcome: Progressing   Problem: Pain Management: Goal: Pain level will decrease Outcome: Progressing   Problem: Health Behavior/Discharge Planning: Goal: Ability to manage health-related needs will improve Outcome: Progressing   Problem: Activity: Goal: Risk for activity intolerance will decrease Outcome: Progressing

## 2024-03-08 NOTE — Progress Notes (Signed)
 Attending Progress Note  History: Robert Maldonado is here for lumbar decompression, they are currently 6 Days Post-Op.   POD6: Pt complaining uncontrolled pain overnight.  POD5: Pt resting comfortably in bed this morning. Reporting better pain control overnight  POD4: Pt experienced significant back pain this morning and continues to complain of dizziness when ambulating  POD3: Had difficulty with orthostasis and dizziness yesterday, was unable to walk to bathroom safely. POD2: Got dizzy yesterday with PTOT.  Feeling better today. POD1: Doing well.  Midline back pain but feels like his legs feel better already.  Physical Exam: Vitals:   03/07/24 2115 03/08/24 0309  BP: 116/77 107/72  Pulse: 81 90  Resp: 18 16  Temp: 98.8 F (37.1 C) 98.1 F (36.7 C)  SpO2: 100% 99%    AA Ox3 CNI  Resting in bed and appears uncomfortable   Incision with clean dressing in place  Data:  Other tests/results: No new testing  Assessment/Plan:  Robert Maldonado has had continued dizziness and orthostasis after surgery.  Echo pending  - Drains: Removed - mobilize - pain control; made changes today - DVT prophylaxis - IM consulted and following. We appreciate your assistance.  - despite medications changes he continues to have dizziness. MRI brain ordered - PTOT, working on possible SNF placement   Edsel Goods PA-C Department of Neurosurgery

## 2024-03-08 NOTE — NC FL2 (Signed)
 [place]  MEDICAID FL2 LEVEL OF CARE FORM     IDENTIFICATION  Patient Name: Robert Maldonado Birthdate: [birth year] Sex: male Admission Date (Current Location): 03/02/2024  [facility] and [place] Number:  Chiropodist and Address:  [facility], [address] [place], [place] [address]      Provider Number: [id number]  Attending Physician Name and Address:  Claudene Penne ORN, MD  Relative Name and Phone Number:  Nat Claudene [phone]    Current Level of Care: Hospital Recommended Level of Care: Skilled Nursing Facility Prior Approval Number:    Date Approved/Denied:   PASRR Number: [id number]  Discharge Plan: SNF    Current Diagnoses: Patient Active Problem List   Diagnosis Date Noted   Lumbar stenosis 03/02/2024   Spondylosis, lumbar, with myelopathy 01/25/2024   Left leg weakness 01/25/2024   Spinal stenosis, lumbar region, with neurogenic claudication 01/25/2024   S/P cervical spinal fusion 07/20/2023   Cervical radiculopathy 06/08/2023   Cervical disc disorder with radiculopathy of cervical region 05/17/2023   Left arm weakness 05/17/2023   Gait abnormality 03/18/2023   Weakness 03/18/2023   Cervical myelopathy (HCC) 03/18/2023   Spondylosis, cervical, with myelopathy 02/17/2023   Cellulitis and abscess of foot 09/27/2016   Cellulitis 09/26/2016    Orientation RESPIRATION BLADDER Height & Weight     Time, Self, Situation, Place  Normal Continent Weight: 124 kg Height:  6' 4 (193 cm)  BEHAVIORAL SYMPTOMS/MOOD NEUROLOGICAL BOWEL NUTRITION STATUS      Continent Diet (regular)  AMBULATORY STATUS COMMUNICATION OF NEEDS Skin   Extensive Assist Verbally Surgical wounds (Surgical incision to back- honeycomb dressing)                       Personal Care Assistance Level of Assistance  Bathing, Dressing Bathing Assistance: Limited assistance   Dressing Assistance: Limited assistance     Functional Limitations Info              SPECIAL CARE FACTORS FREQUENCY  PT (By licensed PT), OT (By licensed OT)     PT Frequency: 5x week OT Frequency: 5x week            Contractures Contractures Info: Not present    Additional Factors Info  Code Status, Allergies Code Status Info: Full Allergies Info: Oxycodone            Current Medications (03/08/2024):  This is the current hospital active medication list Current Facility-Administered Medications  Medication Dose Route Frequency Provider Last Rate Last Admin   acetaminophen  (TYLENOL ) tablet 1,000 mg  1,000 mg Oral QID Claudene Penne ORN, MD   1,000 mg at 03/08/24 1654   amitriptyline  (ELAVIL ) tablet 50 mg  50 mg Oral QHS Ulis Bottcher, PA-C   50 mg at 03/07/24 2117   bisacodyl  (DULCOLAX) EC tablet 5 mg  5 mg Oral Daily PRN Ulis Bottcher, PA-C       cholecalciferol  (VITAMIN D3) 25 MCG (1000 UNIT) tablet 1,000 Units  1,000 Units Oral BID Ulis Bottcher, PA-C   1,000 Units at 03/08/24 0935   clotrimazole  (LOTRIMIN ) 1 % cream   Topical BID Ulis Bottcher, PA-C       cyanocobalamin  (VITAMIN B12) tablet 1,000 mcg  1,000 mcg Oral BID Ulis Bottcher, PA-C   1,000 mcg at 03/08/24 9066   diazepam  (VALIUM ) tablet 5 mg  5 mg Oral Q8H PRN Claudene Penne ORN, MD   5 mg at 03/08/24 1406   docusate sodium  (COLACE) capsule  100 mg  100 mg Oral BID Ulis Bottcher, PA-C   100 mg at 03/08/24 0935   empagliflozin  (JARDIANCE ) tablet 10 mg  10 mg Oral Daily Ulis Bottcher, PA-C   10 mg at 03/08/24 9065   enoxaparin  (LOVENOX ) injection 40 mg  40 mg Subcutaneous Q24H Frisbie, Brooke, PA-C   40 mg at 03/08/24 0931   feeding supplement (ENSURE PLUS HIGH PROTEIN) liquid 237 mL  237 mL Oral BID BM Gregory Edsel Ruth, PA   237 mL at 03/08/24 1045   gabapentin  (NEURONTIN ) capsule 1,600 mg  1,600 mg Oral TID Ulis Bottcher, PA-C   1,600 mg at 03/08/24 1654   HYDROmorphone  (DILAUDID ) tablet 1 mg  1 mg Oral Q3H PRN Koch, Danielle Lee, PA   1 mg at 03/08/24 1405   ketorolac   (TORADOL ) 15 MG/ML injection 7.5 mg  7.5 mg Intravenous Q8H Claudene Penne ORN, MD   7.5 mg at 03/08/24 1406   magnesium  citrate solution 1 Bottle  1 Bottle Oral Once PRN Frisbie, Brooke, PA-C       meclizine  (ANTIVERT ) tablet 25 mg  25 mg Oral TID PRN Adhikari, Amrit, MD   25 mg at 03/08/24 9057   menthol  (CEPACOL) lozenge 3 mg  1 lozenge Oral PRN Ulis Bottcher, PA-C       Or   phenol (CHLORASEPTIC) mouth spray 1 spray  1 spray Mouth/Throat PRN Ulis Bottcher, PA-C       methocarbamol  (ROBAXIN ) tablet 750 mg  750 mg Oral TID Claudene Penne ORN, MD   750 mg at 03/08/24 1655   ondansetron  (ZOFRAN ) tablet 4 mg  4 mg Oral Q6H PRN Ulis Bottcher, PA-C       Or   ondansetron  (ZOFRAN ) injection 4 mg  4 mg Intravenous Q6H PRN Ulis Bottcher, PA-C   4 mg at 03/06/24 9078   Oral care mouth rinse  15 mL Mouth Rinse PRN Claudene Penne ORN, MD       pantoprazole  (PROTONIX ) EC tablet 40 mg  40 mg Oral Daily Ulis Bottcher, PA-C   40 mg at 03/08/24 0933   PARoxetine  (PAXIL ) tablet 20 mg  20 mg Oral Daily Ulis Bottcher, PA-C   20 mg at 03/08/24 0935   polyethylene glycol (MIRALAX  / GLYCOLAX ) packet 17 g  17 g Oral Daily Claudene Penne ORN, MD   17 g at 03/08/24 9062   senna (SENOKOT) tablet 8.6 mg  1 tablet Oral BID Ulis Bottcher, PA-C   8.6 mg at 03/08/24 9065   traMADol  (ULTRAM ) tablet 100 mg  100 mg Oral Q6H Claudene Penne ORN, MD   100 mg at 03/08/24 1655   traZODone  (DESYREL ) tablet 50-100 mg  50-100 mg Oral QHS PRN Clois Fret, MD   100 mg at 03/07/24 2116     Discharge Medications: Please see discharge summary for a list of discharge medications.  Relevant Imaging Results:  Relevant Lab Results:   Additional Information SS# [id number]  Daved JONETTA Hamilton, RN

## 2024-03-08 NOTE — Progress Notes (Signed)
 Occupational Therapy Treatment Patient Details Name: Robert Maldonado MRN: [id number] DOB: 12-26-1965 Today's Date: 03/08/2024   History of present illness Robert Maldonado is a 58yoM who presents to [facility] 03/02/24 for elective Left L3-S1 laminectomy in the setting of lumbar spinal stenosis, myelopathy, LLE pain, weakness, myoclonus. PMH: ACDF. (Simultaneous filing. User may not have seen previous data.)   OT comments  Patient seen for OT treatment on this date. Upon arrival to room patient resting in bed, agreeable to treatment. Patient transitioned to EOB with supervision, reports less pain overall. Patient continues to require +2 for mobility due to BLE weakness, patient able to ambulate about 30 feet with r/w x 2 assist, needed seated rest break due to pain in back/LLE; took seated rest break; attempted another bout of ambulation but unable to tolerate. Transitioned back to seated.  Patient ended treatment in recliner  all needs within reach. Patient making fair progress toward goals, will continue to follow POC. Discussed CIR vs SNF, consult placed. Discharge recommendation remains appropriate.        If plan is discharge home, recommend the following:  A little help with bathing/dressing/bathroom;A lot of help with walking and/or transfers;Help with stairs or ramp for entrance   Equipment Recommendations  None recommended by OT    Recommendations for Other Services Rehab consult    Precautions / Restrictions Precautions Precautions: Fall;Back (Simultaneous filing. User may not have seen previous data.) Recall of Precautions/Restrictions: Intact (Simultaneous filing. User may not have seen previous data.) Precaution/Restrictions Comments: no brace needed (Simultaneous filing. User may not have seen previous data.) Restrictions Weight Bearing Restrictions Per Provider Order: No (Simultaneous filing. User may not have seen previous data.)       Mobility Bed Mobility Overal bed mobility:  Needs Assistance (Simultaneous filing. User may not have seen previous data.) Bed Mobility: Supine to Sit (Simultaneous filing. User may not have seen previous data.)     Supine to sit: Used rails     General bed mobility comments: no cues needed    Transfers Overall transfer level: Needs assistance (Simultaneous filing. User may not have seen previous data.) Equipment used: Rolling walker (2 wheels) (Simultaneous filing. User may not have seen previous data.) Transfers: Sit to/from Stand (Simultaneous filing. User may not have seen previous data.) Sit to Stand: +2 physical assistance, Mod assist, Min assist (Simultaneous filing. User may not have seen previous data.)           General transfer comment: mod-min A x 2 for sit<>stand with cues for hand placement (Simultaneous filing. User may not have seen previous data.)     Balance Overall balance assessment: Needs assistance Sitting-balance support: Feet supported Sitting balance-Leahy Scale: Good     Standing balance support: Bilateral upper extremity supported, During functional activity, Reliant on assistive device for balance Standing balance-Leahy Scale: Fair Standing balance comment: high fall risk due to BLE weakness                           ADL either performed or assessed with clinical judgement   ADL                                              Extremity/Trunk Assessment              Vision  Perception     Praxis     Communication Communication Communication: No apparent difficulties (Simultaneous filing. User may not have seen previous data.)   Cognition Arousal: Alert (Simultaneous filing. User may not have seen previous data.) Behavior During Therapy: [facility] for tasks assessed/performed (Simultaneous filing. User may not have seen previous data.) Cognition: No apparent impairments                               Following commands: Intact  (Simultaneous filing. User may not have seen previous data.)        Cueing   Cueing Techniques: Verbal cues, Tactile cues (Simultaneous filing. User may not have seen previous data.)  Exercises      Shoulder Instructions       General Comments      Pertinent Vitals/ Pain       Pain Assessment Pain Assessment: 0-10 (Simultaneous filing. User may not have seen previous data.) Pain Score: 4  Pain Location: back (Simultaneous filing. User may not have seen previous data.) Pain Descriptors / Indicators: Discomfort, Grimacing, Guarding (Simultaneous filing. User may not have seen previous data.) Pain Intervention(s): Monitored during session, Premedicated before session (Simultaneous filing. User may not have seen previous data.)  Home Living                                          Prior Functioning/Environment              Frequency  Min 3X/week        Progress Toward Goals  OT Goals(current goals can now be found in the care plan section)  Progress towards OT goals: Progressing toward goals  Acute Rehab OT Goals Patient Stated Goal: to get stronger OT Goal Formulation: With patient Time For Goal Achievement: 03/17/24 Potential to Achieve Goals: Good ADL Goals Pt Will Perform Grooming: with modified independence;sitting;standing Pt Will Perform Lower Body Dressing: with modified independence;sitting/lateral leans;sit to/from stand Pt Will Transfer to Toilet: with modified independence;ambulating Pt Will Perform Toileting - Clothing Manipulation and hygiene: with modified independence;sit to/from stand;sitting/lateral leans  Plan      Co-evaluation      Reason for Co-Treatment: For patient/therapist safety;To address functional/ADL transfers (Simultaneous filing. User may not have seen previous data.) PT goals addressed during session: Mobility/safety with mobility (Simultaneous filing. User may not have seen previous data.) OT goals  addressed during session: Proper use of Adaptive equipment and DME      AM-PAC OT 6 Clicks Daily Activity     Outcome Measure   Help from another person eating meals?: None Help from another person taking care of personal grooming?: A Little Help from another person toileting, which includes using toliet, bedpan, or urinal?: A Little Help from another person bathing (including washing, rinsing, drying)?: A Little Help from another person to put on and taking off regular upper body clothing?: A Little Help from another person to put on and taking off regular lower body clothing?: A Little 6 Click Score: 19    End of Session Equipment Utilized During Treatment: Rolling walker (2 wheels)  OT Visit Diagnosis: Other abnormalities of gait and mobility (R26.89)   Activity Tolerance Patient limited by pain   Patient Left in chair;with call bell/phone within reach   Nurse Communication  Time: 8678-8666 OT Time Calculation (min): 12 min  Charges: OT Treatments $Self Care/Home Management : 8-22 mins  Rogers Clause, OT/L MSOT, 03/08/2024   Robert Maldonado Clause 03/08/2024, 1:44 PM

## 2024-03-08 NOTE — Progress Notes (Signed)
 Inpatient Rehab Admissions Coordinator:   Per therapy recommendations pt was screened for CIR by Reche Lowers, PT, DPT.  Pt admitted for elective L3-S1 laminectomy for lumbar stenosis, myelopathy, myoclonus.  Post op course pain control, dizziness. Post op cranial imaging negative for acute process.  He is min assist with a RW, heavy reliance on device.  Unfortunately, based on payor trends I do not this [facility] Medicare will approve this case for CIR.  We will not pursue at this time.    Reche Lowers, PT, DPT Admissions Coordinator [phone] 03/08/24 4:22 PM

## 2024-03-08 NOTE — Progress Notes (Signed)
  Progress Note   Patient: Robert Maldonado FMW:[id number] DOB: [birth year] DOA: 03/02/2024     4 DOS: the patient was seen and examined on 03/08/2024   Brief hospital course: Patient is a 58 year old male with history of depression, peripheral neuropathy, prediabetes, rheumatoid arthritis, lumbar stenosis who was admitted on 11/20 under neurosurgery service for lumbar laminectomy, medial facetectomy for lateral recess decompression L3-S1.  Postoperatively, patient became diaphoretic, dizzy upon standing with physical therapy.  We were consulted for this.  Orthostatic vitals have been negative.  Currently blood pressure stable.  Echo normal EF.   Assessment and Plan:  Dizziness/near syncope:  Suspected to be from orthostatic hypotension, opioids.   Atenolol  discontinued 11/24.  Initially orthostatic vitals were positive.  Orthostatic vitals taken   on 11/24 have  been negative. 11/25: still has some orthostatics.   Currently blood pressure normal 11/26.   Echo showed EF of 55 to 60%, grade 1 diastolic dysfunction.  No valvular abnormalities.   He looks euvolemic.   Still complains of some dizziness while standing up. Ordered TED hose. MRI brain 11/26: negative.  We will continue meclizine  prn   Lumbar stenosis:  Status postlaminectomy.   Neurosurgery are the primary.   Management as per neurosurgery.   Complains of  pain on the surgical site.  PT/OT following and recommended home health.   CKD stage IIIa: Currently kidney function at baseline   History of depression: On Paxil    Hypertension: Takes atenolol  at home.  Discontinued given her orthostatic hypotension.  Currently blood pressure  stable   Rheumatoid arthritis: Currently stable.  Currently not following with rheumatology   Prediabetes: A1c 5.7.  Currently blood sugars well-controlled      Subjective: Feels much better, dizziness has improved significantly  Physical Exam: Vitals:   03/07/24 2115 03/08/24 0309 03/08/24  0955 03/08/24 1121  BP: 116/77 107/72 (!) 99/56 (!) 136/93  Pulse: 81 90 100 91  Resp: 18 16 18 17   Temp: 98.8 F (37.1 C) 98.1 F (36.7 C) 97.9 F (36.6 C) 98.3 F (36.8 C)  TempSrc: Oral     SpO2: 100% 99% 98% 99%  Weight:      Height:       General exam: Overall comfortable, not in distress,obese HEENT: PERRL Respiratory system:  no wheezes or crackles  Cardiovascular system: S1 & S2 heard, RRR.  Gastrointestinal system: Abdomen is nondistended, soft and nontender. Central nervous system: Alert and oriented Extremities: No edema, no clubbing ,no cyanosis, surgical wound covered with dressing on the lower back Skin: No rashes, no ulcers,no icterus    Data Reviewed:  MRI brain no acute pathology  Family Communication: None today  Disposition: Status is: Inpatient Remains inpatient appropriate because: Ongoing recovery from back surgery  Planned Discharge Destination: Rehab    Time spent: 35 minutes  Author: Nena Rebel, MD 03/08/2024 3:09 PM  For on call review [email or web address].

## 2024-03-08 NOTE — Progress Notes (Cosign Needed)
 Physical Therapy Treatment Patient Details Name: Robert Maldonado MRN: [id number] DOB: [birth year] Today's Date: 03/08/2024   History of Present Illness Joseandres Kraai is a 58yoM who presents to [facility] 03/02/24 for elective Left L3-S1 laminectomy in the setting of lumbar spinal stenosis, myelopathy, LLE pain, weakness, myoclonus. PMH: ACDF. (Simultaneous filing. User may not have seen previous data.)    PT Comments  Pt ready for session.  Stated he is well medicated as adjustments have been made.  He is able to get to EOB with rails, inc time and discomfort.  He stands to RW and pulls on RW despite cues to push from bed.  Once up he is able to progress gait 30' with heavy lean on walker and irregular step length often taking larger steps than generally safe.  +2 with recliner follow remains necessary for pt and staff safety.  He does attempt second gait trial but only able to take a few steps before asking to sit.  Reports pain LLE is barrier today.  Pt with LE weakness bilaterally and remains at inc risk for falls. Minimal c/o dizziness this session but was too dizzy this am to participate so return later was needed.  Discussed discharge plan with pt. He is progressing daily.  He remains motivated with supportive wife and family.  Given extended hospital stay and past medical history of multiple trauma and also recent neck surgery, will ask for CIR consult to see if it a viable option for pt.  If CIR is not available, SNF would be required.   If plan is discharge home, recommend the following: A little help with bathing/dressing/bathroom;Assistance with cooking/housework;Direct supervision/assist for medications management;Assist for transportation;Help with stairs or ramp for entrance;Two people to help with walking and/or transfers   Can travel by private vehicle        Equipment Recommendations  Rolling walker (2 wheels)    Recommendations for Other Services Rehab consult     Precautions /  Restrictions Restrictions Other Position/Activity Restrictions: BP's/dizziness     Mobility  Bed Mobility     Rolling: Used rails Sidelying to sit: Supervision, HOB elevated, Used rails         Patient Response: Cooperative  Transfers                        Ambulation/Gait Ambulation/Gait assistance: Min assist, +2 physical assistance, +2 safety/equipment Gait Distance (Feet): 30 Feet Assistive device: Rolling walker (2 wheels) Gait Pattern/deviations: Decreased step length - right, Decreased step length - left, Decreased dorsiflexion - right, Decreased dorsiflexion - left, Decreased stride length Gait velocity: decreased     General Gait Details: 30' x 1 then 5' x 1 limtied by LLE shooting pain   Stairs             Wheelchair Mobility     Tilt Bed Tilt Bed Patient Response: Cooperative  Modified Rankin (Stroke Patients Only)       Balance Overall balance assessment: Needs assistance Sitting-balance support: Feet supported Sitting balance-Leahy Scale: Good     Standing balance support: Bilateral upper extremity supported, During functional activity, Reliant on assistive device for balance Standing balance-Leahy Scale: Fair Standing balance comment: remains at inc risk for falls due to LE weakness and pain.  less dizziness today that does not limite session                            Communication Communication  Communication: No apparent difficulties (Simultaneous filing. User may not have seen previous data.)  Cognition       PT - Cognitive impairments: No apparent impairments                       PT - Cognition Comments: Pt is A and O x 4        Cueing    Exercises      General Comments        Pertinent Vitals/Pain Pain Assessment Faces Pain Scale: Hurts even more    Home Living                          Prior Function            PT Goals (current goals can now be found in the care  plan section) Progress towards PT goals: Progressing toward goals    Frequency    7X/week      PT Plan      Co-evaluation PT/OT/SLP Co-Evaluation/Treatment: Yes Reason for Co-Treatment: For patient/therapist safety;To address functional/ADL transfers (Simultaneous filing. User may not have seen previous data.) PT goals addressed during session: Mobility/safety with mobility (Simultaneous filing. User may not have seen previous data.) OT goals addressed during session: Proper use of Adaptive equipment and DME      AM-PAC PT 6 Clicks Mobility   Outcome Measure  Help needed turning from your back to your side while in a flat bed without using bedrails?: A Little Help needed moving from lying on your back to sitting on the side of a flat bed without using bedrails?: A Little Help needed moving to and from a bed to a chair (including a wheelchair)?: A Lot Help needed standing up from a chair using your arms (e.g., wheelchair or bedside chair)?: A Little Help needed to walk in hospital room?: A Lot Help needed climbing 3-5 steps with a railing? : Total 6 Click Score: 14    End of Session Equipment Utilized During Treatment: Gait belt Activity Tolerance: Patient limited by fatigue;Patient limited by pain Patient left: with call bell/phone within reach;in chair Nurse Communication: Mobility status PT Visit Diagnosis: Difficulty in walking, not elsewhere classified (R26.2);Other abnormalities of gait and mobility (R26.89);Other symptoms and signs involving the nervous system (R29.898);Dizziness and giddiness (R42);Unsteadiness on feet (R26.81);Muscle weakness (generalized) (M62.81)     Time: 8679-8668 PT Time Calculation (min) (ACUTE ONLY): 11 min  Charges:    $Gait Training: 8-22 mins PT General Charges $$ ACUTE PT VISIT: 1 Visit                   Lauraine Gills, PTA 03/08/24, 1:45 PM

## 2024-03-08 NOTE — Progress Notes (Signed)
 OT Cancellation Note  Patient Details Name: Robert Maldonado MRN: [id number] DOB: [birth year]   Cancelled Treatment:    Reason Eval/Treat Not Completed: Patient at procedure or test/ unavailable;Other (comment) (upon arrival to room, transport present for MRI) OT will attempt later today as able.  Maryelizabeth CHRISTELLA Clause 03/08/2024, 11:52 AM

## 2024-03-08 NOTE — TOC Progression Note (Addendum)
 Transition of Care [facility]) - Progression Note    Patient Details  Name: Robert Maldonado MRN: [id number] Date of Birth: 1966-02-18  Transition of Care [facility] CM/SW Contact  Daved JONETTA Hamilton, RN Phone Number: 03/08/2024, 5:16 PM  Clinical Narrative:     Late Entry:  Met with patient at bedside, introduced self and explained role. Discussed with patient therapy recommending STR with possible CIR consideration. Patient verbalized understanding and agreement to this plan. Discussed with patient TOC will also send referral for SNF in the event patient is unable to be accepted for CIR, patient verbalized understanding.   At the time of this documentation this CM reviewed note that patient does not meet criteria for CIR. This CM will begin SNF workup.   Update:  PASRR obtained 03/08/2024  [id number] A FL2 sent for signature Facility search initiated in HUB    Expected Discharge Plan: Home w Home Health Services Barriers to Discharge: Continued Medical Work up               Expected Discharge Plan and Services   Discharge Planning Services: CM Consult Post Acute Care Choice: Home Health Living arrangements for the past 2 months: Single Family Home Expected Discharge Date: 03/05/24               DME Arranged: N/A         HH Arranged: OT, PT HH Agency: [facility] Date HH Agency Contacted: 03/03/24 Time HH Agency Contacted: 1346 Representative spoke with at [facility] Agency: Shaun   Social Drivers of Health (SDOH) Interventions SDOH Screenings   Food Insecurity: No Food Insecurity (03/06/2024)  Housing: Low Risk  (03/06/2024)  Transportation Needs: No Transportation Needs (03/06/2024)  Utilities: Not At Risk (03/06/2024)  Financial Resource Strain: Medium Risk (10/07/2023)   Received from [facility]  Tobacco Use: Low Risk  (03/02/2024)    Readmission Risk Interventions     No data to display

## 2024-03-09 DIAGNOSIS — I951 Orthostatic hypotension: Secondary | ICD-10-CM | POA: Diagnosis not present

## 2024-03-09 DIAGNOSIS — M48061 Spinal stenosis, lumbar region without neurogenic claudication: Secondary | ICD-10-CM | POA: Diagnosis not present

## 2024-03-09 LAB — GLUCOSE, CAPILLARY
Glucose-Capillary: 104 mg/dL — ABNORMAL HIGH (ref 70–99)
Glucose-Capillary: 107 mg/dL — ABNORMAL HIGH (ref 70–99)
Glucose-Capillary: 104 mg/dL — ABNORMAL HIGH (ref 70–99)
Glucose-Capillary: 121 mg/dL — ABNORMAL HIGH (ref 70–99)
Glucose-Capillary: 141 mg/dL — ABNORMAL HIGH (ref 70–99)

## 2024-03-09 NOTE — Progress Notes (Signed)
  Progress Note   Patient: Robert Maldonado FMW:[id number] DOB: 08/12/1965 DOA: 03/02/2024     5 DOS: the patient was seen and examined on 03/09/2024   Brief hospital course: Patient is a 58 year old male with history of depression, peripheral neuropathy, prediabetes, rheumatoid arthritis, lumbar stenosis who was admitted on 11/20 under neurosurgery service for lumbar laminectomy, medial facetectomy for lateral recess decompression L3-S1.  Postoperatively, patient became diaphoretic, dizzy upon standing with physical therapy.  We were consulted for this.  Orthostatic vitals have been negative.  Currently blood pressure stable.  Echo normal EF.    Principal Problem:   Lumbar stenosis Active Problems:   Spondylosis, lumbar, with myelopathy   Left leg weakness   Spinal stenosis, lumbar region, with neurogenic claudication   Assessment and Plan: Dizziness/near syncope: Essential hypertension. Suspected to be from orthostatic hypotension, opioids.   Atenolol  discontinued 11/24.  Initially orthostatic vitals were positive.  Orthostatic vitals taken   on 11/24 have  been negative. 11/25: still has some orthostatics.   Echo showed EF of 55 to 60%, grade 1 diastolic dysfunction.  No valvular abnormalities.   All blood pressure medicine has been discontinued, patient has stable high blood pressure, dizziness has resolved today.  Peripheral hold blood pressure medicine at discharge, restart after seen by PCP if blood pressure start elevated. Nothing more to add, will sign off.   Lumbar stenosis:  Status postlaminectomy.   Neurosurgery are the primary.   Condition stable, patient doing well postop.   CKD stage IIIa: Currently kidney function at baseline   History of depression: On Paxil    Hypertension:  Atenolol  discontinued.   Rheumatoid arthritis: Currently stable.  Currently not following with rheumatology   Prediabetes: A1c 5.7.  Currently blood sugars well-controlled          Subjective:  Patient doing well today, no dizziness when stands up.  Physical Exam: Vitals:   03/08/24 1602 03/08/24 1944 03/09/24 0434 03/09/24 0756  BP: 104/70 117/83 115/72 115/83  Pulse: 80 75 85 84  Resp: 16 18 18 16   Temp: 98.2 F (36.8 C) 98 F (36.7 C) 98 F (36.7 C) (!) 97.4 F (36.3 C)  TempSrc:  Oral    SpO2: 97% 100% 94% 99%  Weight:      Height:       General exam: Appears calm and comfortable  Respiratory system: Clear to auscultation. Respiratory effort normal. Cardiovascular system: S1 & S2 heard, RRR. No JVD, murmurs, rubs, gallops or clicks. No pedal edema. Gastrointestinal system: Abdomen is nondistended, soft and nontender. No organomegaly or masses felt. Normal bowel sounds heard. Central nervous system: Alert and oriented. No focal neurological deficits. Extremities: Symmetric 5 x 5 power. Skin: No rashes, lesions or ulcers Psychiatry: Judgement and insight appear normal. Mood & affect appropriate.    Data Reviewed:  Lab results reviewed.  Family Communication: None  Disposition: Status is: Inpatient Remains inpatient appropriate because: Per neurosurgery.     Time spent: 35 minutes  Author: Murvin Mana, MD 03/09/2024 12:34 PM  For on call review [email or web address].

## 2024-03-09 NOTE — Progress Notes (Signed)
 Physical Therapy Treatment Patient Details Name: Robert Maldonado MRN: [id number] DOB: [birth year] Today's Date: 03/09/2024   History of Present Illness Robert Maldonado is a 58yoM who presents to [facility] 03/02/24 for elective Left L3-S1 laminectomy in the setting of lumbar spinal stenosis, myelopathy, LLE pain, weakness, myoclonus. PMH: ACDF.    PT Comments  Pt continues to show good effort despite ongoing pain and L LE weakness.  He needed only light assist to get to sitting from supine and with heavy UE use (and R LE reliance) stood from bed and recliner w/o direct assist.  Pt continues to show good motivation and effort despite L LE coordination/weakness/pain issues; managed two bouts of ambulation of 25 then 20 ft with heavy walker reliance.  Pt will need ongoing PT, continued with POC.    If plan is discharge home, recommend the following: A little help with bathing/dressing/bathroom;Assistance with cooking/housework;Direct supervision/assist for medications management;Assist for transportation;Help with stairs or ramp for entrance;Two people to help with walking and/or transfers   Can travel by private vehicle      (per progress)  Equipment Recommendations  Rolling walker (2 wheels)    Recommendations for Other Services Rehab consult     Precautions / Restrictions Precautions Precautions: Fall;Back Restrictions Weight Bearing Restrictions Per Provider Order: No     Mobility  Bed Mobility Overal bed mobility: Needs Assistance Bed Mobility: Supine to Sit     Supine to sit: Min assist, Used rails     General bed mobility comments: able to get himself turned and most of the way up to sitting, did need HHA to pull all the way to upright EOB    Transfers Overall transfer level: Needs assistance Equipment used: Rolling walker (2 wheels) Transfers: Sit to/from Stand Sit to Stand: Min assist           General transfer comment: minA to shift weight forward and maintain forward.   Very heavily reliant on R LE (and b/l UES) to rise, L LE minimally positioned to assist despite cuing.    Ambulation/Gait Ambulation/Gait assistance: Min assist Gait Distance (Feet): 25 Feet Assistive device: Rolling walker (2 wheels)         General Gait Details: 25 ft, then 20 ft.  Pt highly reliant on walker, struggled with consistent foot placement, constant cuing to insure L knee/quad engaged for weight acceptance.  Pt struggled with maintaining upright posture depsite walker adjustments.  c/o considerably increasd low back and L LE pain with increased time in standing   Stairs             Wheelchair Mobility     Tilt Bed    Modified Rankin (Stroke Patients Only)       Balance Overall balance assessment: Needs assistance Sitting-balance support: Feet supported Sitting balance-Leahy Scale: Good     Standing balance support: Bilateral upper extremity supported, During functional activity, Reliant on assistive device for balance Standing balance-Leahy Scale: Fair Standing balance comment: remains at inc risk for falls due to LE weakness and pain.  minimal c/o dizziness end of session                            Communication Communication Communication: No apparent difficulties  Cognition Arousal: Alert Behavior During Therapy: WFL for tasks assessed/performed   PT - Cognitive impairments: No apparent impairments  PT - Cognition Comments: Pt is A and O x 4 Following commands: Intact      Cueing Cueing Techniques: Verbal cues, Tactile cues  Exercises Other Exercises Other Exercises: reviewed HEP, encouarged adding consistent QS 10x/hr    General Comments General comments (skin integrity, edema, etc.): Pt eager to work with PT and very pleasant, limited tolerance with standing/walking efforts - highly UE reliant      Pertinent Vitals/Pain Pain Assessment Pain Assessment: 0-10 Pain Score: 8  Pain Location:  back and down L LE    Home Living                          Prior Function            PT Goals (current goals can now be found in the care plan section) Progress towards PT goals: Progressing toward goals    Frequency    7X/week      PT Plan      Co-evaluation              AM-PAC PT 6 Clicks Mobility   Outcome Measure  Help needed turning from your back to your side while in a flat bed without using bedrails?: A Little Help needed moving from lying on your back to sitting on the side of a flat bed without using bedrails?: A Little Help needed moving to and from a bed to a chair (including a wheelchair)?: A Little Help needed standing up from a chair using your arms (e.g., wheelchair or bedside chair)?: A Little Help needed to walk in hospital room?: A Lot Help needed climbing 3-5 steps with a railing? : Total 6 Click Score: 15    End of Session Equipment Utilized During Treatment: Gait belt Activity Tolerance: Patient limited by fatigue;Patient limited by pain Patient left: with call bell/phone within reach;in chair Nurse Communication: Mobility status PT Visit Diagnosis: Difficulty in walking, not elsewhere classified (R26.2);Other abnormalities of gait and mobility (R26.89);Other symptoms and signs involving the nervous system (R29.898);Dizziness and giddiness (R42);Unsteadiness on feet (R26.81);Muscle weakness (generalized) (M62.81)     Time: 8645-8587 PT Time Calculation (min) (ACUTE ONLY): 18 min  Charges:    $Gait Training: 8-22 mins PT General Charges $$ ACUTE PT VISIT: 1 Visit                     Carmin JONELLE Deed, DPT 03/09/2024, 3:23 PM

## 2024-03-09 NOTE — Plan of Care (Signed)
  Problem: Education: Goal: Ability to describe self-care measures that may prevent or decrease complications (Diabetes Survival Skills Education) will improve Outcome: Progressing   Problem: Skin Integrity: Goal: Risk for impaired skin integrity will decrease Outcome: Progressing   Problem: Activity: Goal: Ability to avoid complications of mobility impairment will improve Outcome: Progressing   Problem: Pain Management: Goal: Pain level will decrease Outcome: Progressing   Problem: Skin Integrity: Goal: Will show signs of wound healing Outcome: Progressing

## 2024-03-09 NOTE — Plan of Care (Signed)
  Problem: Skin Integrity: Goal: Risk for impaired skin integrity will decrease Outcome: Progressing   Problem: Safety: Goal: Ability to remain free from injury will improve Outcome: Progressing   Problem: Elimination: Goal: Will not experience complications related to bowel motility Outcome: Progressing

## 2024-03-09 NOTE — Progress Notes (Signed)
 Attending Progress Note  History: Robert Maldonado is here for lumbar decompression, they are currently 7 Days Post-Op.   POD7: Impoved pain control overnight, improved PT performance, brain MRI normal  POD6: Pt complaining uncontrolled pain overnight.  POD5: Pt resting comfortably in bed this morning. Reporting better pain control overnight  POD4: Pt experienced significant back pain this morning and continues to complain of dizziness when ambulating  POD3: Had difficulty with orthostasis and dizziness yesterday, was unable to walk to bathroom safely. POD2: Got dizzy yesterday with PTOT.  Feeling better today. POD1: Doing well.  Midline back pain but feels like his legs feel better already.  Physical Exam: Vitals:   03/09/24 0434 03/09/24 0756  BP: 115/72 115/83  Pulse: 85 84  Resp: 18 16  Temp: 98 F (36.7 C) (!) 97.4 F (36.3 C)  SpO2: 94% 99%    AA Ox3 CNI  Resting in bed and appears comfortable . Incision with clean dressing in place  Data: MR BRAIN WO CONTRAST Result Date: 03/08/2024 EXAM: MRI BRAIN WITHOUT CONTRAST 03/08/2024 12:19:00 PM TECHNIQUE: Multiplanar multisequence MRI of the head/brain was performed without the administration of intravenous contrast. COMPARISON: None available. CLINICAL HISTORY: Syncope/presyncope, cerebrovascular cause suspected, dizziness. FINDINGS: The examination is mildly motion degraded. BRAIN AND VENTRICLES: There is no evidence of an acute infarct, intracranial hemorrhage, mass, midline shift, hydrocephalus, or extra-axial fluid collection. Cerebral volume is within normal limits for age. No significant white matter disease is seen. Major intracranial vascular flow voids are preserved. ORBITS: No acute abnormality. SINUSES AND MASTOIDS: Tiny mucous retention cyst in the left maxillary sinus. Small volume bilateral mastoid fluid. Likely trapped fluid in the left petrous apex. BONES AND SOFT TISSUES: Normal marrow signal. No acute soft tissue  abnormality. IMPRESSION: 1. Unremarkable appearance of the brain for age. Electronically signed by: Dasie Hamburg MD 03/08/2024 12:51 PM EST RP Workstation: [id number]    Assessment/Plan:  Robert Maldonado has improving dizziness after surgery, brain Mri normal  - Drains: Removed - mobilize - pain control; comfortable today, better sleep overngiht.  - DVT prophylaxis - IM consulted and following. We appreciate your assistance.  - despite medications changes he continues to have dizziness. MRI brain ordered - PTOT, working on possible SNF placement   Penne LELON Sharps MD Department of Neurosurgery

## 2024-03-09 NOTE — Plan of Care (Signed)
  Problem: Education: Goal: Ability to describe self-care measures that may prevent or decrease complications (Diabetes Survival Skills Education) will improve Outcome: Progressing   Problem: Coping: Goal: Ability to adjust to condition or change in health will improve Outcome: Progressing   Problem: Fluid Volume: Goal: Ability to maintain a balanced intake and output will improve Outcome: Progressing   Problem: Metabolic: Goal: Ability to maintain appropriate glucose levels will improve Outcome: Progressing   Problem: Skin Integrity: Goal: Risk for impaired skin integrity will decrease Outcome: Progressing   Problem: Tissue Perfusion: Goal: Adequacy of tissue perfusion will improve Outcome: Progressing   

## 2024-03-10 ENCOUNTER — Other Ambulatory Visit: Payer: Self-pay

## 2024-03-10 LAB — GLUCOSE, CAPILLARY
Glucose-Capillary: 157 mg/dL — ABNORMAL HIGH (ref 70–99)
Glucose-Capillary: 115 mg/dL — ABNORMAL HIGH (ref 70–99)

## 2024-03-10 MED ORDER — DOCUSATE SODIUM 100 MG PO CAPS
100.0000 mg | ORAL_CAPSULE | Freq: Two times a day (BID) | ORAL | 0 refills | Status: AC
  Filled 2024-03-10: qty 28, 14d supply, fill #0

## 2024-03-10 MED ORDER — HYDROMORPHONE HCL 2 MG PO TABS
1.0000 mg | ORAL_TABLET | ORAL | 0 refills | Status: DC | PRN
  Filled 2024-03-10: qty 21, 7d supply, fill #0

## 2024-03-10 MED ORDER — DIAZEPAM 5 MG PO TABS
5.0000 mg | ORAL_TABLET | Freq: Three times a day (TID) | ORAL | 0 refills | Status: AC | PRN
  Filled 2024-03-10: qty 21, 7d supply, fill #0

## 2024-03-10 MED ORDER — ACETAMINOPHEN 500 MG PO TABS
1000.0000 mg | ORAL_TABLET | Freq: Three times a day (TID) | ORAL | 0 refills | Status: AC
  Filled 2024-03-10: qty 42, 7d supply, fill #0

## 2024-03-10 MED ORDER — SENNA 8.6 MG PO TABS
1.0000 | ORAL_TABLET | Freq: Every day | ORAL | 0 refills | Status: AC
  Filled 2024-03-10: qty 14, 14d supply, fill #0

## 2024-03-10 NOTE — Progress Notes (Signed)
 Occupational Therapy Treatment Patient Details Name: Robert Maldonado MRN: [id number] DOB: 14-Feb-1966 Today's Date: 03/10/2024   History of present illness Robert Maldonado is a 58yoM who presents to [facility] 03/02/24 for elective Left L3-S1 laminectomy in the setting of lumbar spinal stenosis, myelopathy, LLE pain, weakness, myoclonus. PMH: ACDF.   OT comments  Patient seen for OT/PT treatment on this date. Upon arrival to room patient resting in bed, agreeable to treatment. Patient highly motivated to DC home vs rehab; patient has 4 STE and is agreeable to trial with PT/OT; Patient performed SPT from EOB to recliner without using AD properly; instructed on importance of using consistently, patient agreeable. Patient transported via recliner to rehab gym; PT instructed on body mechanics/sequencing for ascending/descending steps; patient agreeable and was able to return demo with significant effort and reliance thru BUE. Patient presented with decreased coordination bilaterally in LE today compared to others but denies significant pain and reports it's tolerable. Patient ambulated 60 feet back to room with r/w and CGA, significant UE use on walker. Discussed at length with patient implications for going home and certain safety measures that need to be implemented, patient was receptive and agreeable and feels confident that his spouse and other family will be able to assist with his current status; he prefers to dc home with [facility] vs SNF, OT/PT agreeable and updated team.  Patient ended treatment in recliner with all needs within reach. Patient making good progress toward goals, will continue to follow POC. Discharge recommendation remains appropriate.        If plan is discharge home, recommend the following:  A little help with bathing/dressing/bathroom;A lot of help with walking and/or transfers;Help with stairs or ramp for entrance   Equipment Recommendations  None recommended by OT    Recommendations for  Other Services      Precautions / Restrictions Precautions Precautions: Fall;Back Recall of Precautions/Restrictions: Intact Precaution/Restrictions Comments: no brace needed Restrictions Weight Bearing Restrictions Per Provider Order: No       Mobility Bed Mobility Overal bed mobility: Needs Assistance Bed Mobility: Supine to Sit Rolling: Used rails Sidelying to sit: Supervision, HOB elevated, Used rails       General bed mobility comments: able to get EOB without physical A    Transfers Overall transfer level: Needs assistance Equipment used: Rolling walker (2 wheels) Transfers: Sit to/from Stand Sit to Stand: Contact guard assist, Supervision                 Balance Overall balance assessment: Needs assistance Sitting-balance support: Feet supported Sitting balance-Leahy Scale: Good     Standing balance support: Bilateral upper extremity supported, During functional activity, Reliant on assistive device for balance Standing balance-Leahy Scale: Fair Standing balance comment: remains at inc risk for falls due to LE weakness and pain.  minimal c/o dizziness end of session                           ADL either performed or assessed with clinical judgement   ADL Overall ADL's : Needs assistance/impaired                                       General ADL Comments: will require A for LB self care tasks, wife able/willing to assist    Extremity/Trunk Assessment Upper Extremity Assessment Upper Extremity Assessment: Overall WFL for tasks assessed  Lower Extremity Assessment Lower Extremity Assessment: Defer to PT evaluation        Vision       Perception     Praxis     Communication Communication Communication: No apparent difficulties   Cognition Arousal: Alert Behavior During Therapy: WFL for tasks assessed/performed Cognition: No apparent impairments                               Following commands:  Intact        Cueing   Cueing Techniques: Verbal cues, Tactile cues  Exercises      Shoulder Instructions       General Comments      Pertinent Vitals/ Pain       Pain Assessment Pain Assessment: No/denies pain  Home Living                                          Prior Functioning/Environment              Frequency  Min 3X/week        Progress Toward Goals  OT Goals(current goals can now be found in the care plan section)  Progress towards OT goals: Progressing toward goals  Acute Rehab OT Goals Patient Stated Goal: to go home OT Goal Formulation: With patient Time For Goal Achievement: 03/17/24 Potential to Achieve Goals: Good ADL Goals Pt Will Perform Grooming: with modified independence;sitting;standing Pt Will Perform Lower Body Dressing: with modified independence;sitting/lateral leans;sit to/from stand Pt Will Transfer to Toilet: with modified independence;ambulating Pt Will Perform Toileting - Clothing Manipulation and hygiene: with modified independence;sit to/from stand;sitting/lateral leans  Plan      Co-evaluation    PT/OT/SLP Co-Evaluation/Treatment: Yes Reason for Co-Treatment: For patient/therapist safety;To address functional/ADL transfers PT goals addressed during session: Mobility/safety with mobility OT goals addressed during session: Proper use of [facility] equipment and DME      AM-PAC OT 6 Clicks Daily Activity     Outcome Measure   Help from another person eating meals?: None Help from another person taking care of personal grooming?: A Little Help from another person toileting, which includes using toliet, bedpan, or urinal?: A Little Help from another person bathing (including washing, rinsing, drying)?: A Little Help from another person to put on and taking off regular upper body clothing?: A Little Help from another person to put on and taking off regular lower body clothing?: A Little 6 Click Score:  19    End of Session Equipment Utilized During Treatment: Gait belt;Rolling walker (2 wheels)  OT Visit Diagnosis: Other abnormalities of gait and mobility (R26.89)   Activity Tolerance Patient tolerated treatment well   Patient Left in chair;with call bell/phone within reach   Nurse Communication          Time: 8981-8951 OT Time Calculation (min): 30 min  Charges: OT General Charges $OT Visit: 1 Visit OT Treatments $Self Care/Home Management : 8-22 mins  Rogers Clause, OT/L MSOT, 03/10/2024   Maryelizabeth CHRISTELLA Clause 03/10/2024, 11:05 AM

## 2024-03-10 NOTE — Progress Notes (Signed)
 Patient is being discharged to home with wife at bedside. Patient is alert and oriented X4. Meds to bed has been delivered to patient. Front wheel walker delivered to room and signed by daughter. Patient is able to ambulate with walker with SBA for safety. IV to left hand has been removed with catheter and tip intact. Site covered with gauze and tape. Discharge instructions and prescription have been given to patient and wife with understanding. Staff to transport to lobby for discharge.

## 2024-03-10 NOTE — TOC Transition Note (Signed)
 Transition of Care [facility]) - Discharge Note   Patient Details  Name: Robert Maldonado MRN: [id number] Date of Birth: 12-21-1965  [facility] CM/SW Contact:  Shasta DELENA Daring, RN Phone Number: 03/10/2024, 4:08 PM   Clinical Narrative:    Patient has signed discharge note to go home with [facility]. Patient selected [facility] for services. Shaun notified. RNCM signing off.   Final next level of care: Home w Home Health Services Barriers to Discharge: Barriers Resolved   Patient Goals and CMS Choice Patient states their goals for this hospitalization and ongoing recovery are:: For the dizziness to resolve and his vision improve and go home CMS Medicare.gov Compare Post Acute Care list provided to:: Patient Choice offered to / list presented to : Patient      Discharge Placement                       Discharge Plan and Services Additional resources added to the After Visit Summary for     Discharge Planning Services: CM Consult Post Acute Care Choice: Home Health          DME Arranged: N/A         HH Arranged: PT, OT HH Agency: [facility] Date HH Agency Contacted: 03/10/24 Time HH Agency Contacted: [phone] Representative spoke with at [facility] Agency: Shaun  Social Drivers of Health (SDOH) Interventions SDOH Screenings   Food Insecurity: No Food Insecurity (03/06/2024)  Housing: Low Risk  (03/06/2024)  Transportation Needs: No Transportation Needs (03/06/2024)  Utilities: Not At Risk (03/06/2024)  Financial Resource Strain: Medium Risk (10/07/2023)   Received from [facility]  Tobacco Use: Low Risk  (03/02/2024)     Readmission Risk Interventions     No data to display

## 2024-03-10 NOTE — Discharge Summary (Signed)
 [facility] Discharge Summary   Patient: Robert Maldonado FMW:[id number] DOB: Dec 14, 1965 PCP: Cleotilde Oneil FALCON, MD  Date of admission: 03/02/2024  Date of Surgery: 03/02/2024  Surgery: Procedure(s) (LRB): Lumbar laminectomy medial facetectomy and lateral recess decompression, L3-S1 (N/A)  Date of discharge:  03/10/24  8 Days Post-Op  Discharge Condition: Fair  Recommendations at discharge:  Follow-up as recommended.  Discharge Diagnoses: Principal Problem:   Lumbar stenosis Active Problems:   Spondylosis, lumbar, with myelopathy   Left leg weakness   Spinal stenosis, lumbar region, with neurogenic claudication  Resolved Problems:   * No resolved hospital problems. [facility] Course: The patient underwent L3-S1 lumbar laminectomy medial facetectomy and lateral recess decompression on 03/02/2024 for severe lumbar stenosis with neurogenic claudication and left lower extremity weakness numbness and tingling as well as refractory pain.  They were admitted to the overnight observation unit postoperatively.  Unfortunately had severe dizziness vertigo and shaking and required inpatient hospitalization.  Had difficulty with initial physical therapy due to these symptomatologies as well.  However was found to be having a likely reaction to scopolamine  and after that was removed continued to progress well.  03/10/2024 he was deemed safe for discharge with home health therapy once his pain control was under adequate control with an oral regimen, as well as evaluation with physical and Occupational Therapy was deemed safe.  Spoke with him and his family and he would like to go home with home health services.  Consultants: OT/PT Additional Procedures: None  Exam: Vitals:   03/10/24 0728  BP: 129/81  Pulse: 93  Resp: 17  Temp: 97.8 F (36.6 C)  SpO2: 93%   The physical exam is generally normal. Patient appears well, alert and oriented x 3, pleasant, cooperative. Vitals are as  noted. Abdomen is soft, no tenderness. Extremities are normal.  Neurological exam is at baseline, continued weakness in LLE.  Incision CDI with fresh dressing  The results of significant diagnostics from this hospitalization (including imaging, microbiology, ancillary and laboratory) are listed below for reference.  Imaging Studies: MR BRAIN WO CONTRAST Result Date: 03/08/2024 EXAM: MRI BRAIN WITHOUT CONTRAST 03/08/2024 12:19:00 PM TECHNIQUE: Multiplanar multisequence MRI of the head/brain was performed without the administration of intravenous contrast. COMPARISON: None available. CLINICAL HISTORY: Syncope/presyncope, cerebrovascular cause suspected, dizziness. FINDINGS: The examination is mildly motion degraded. BRAIN AND VENTRICLES: There is no evidence of an acute infarct, intracranial hemorrhage, mass, midline shift, hydrocephalus, or extra-axial fluid collection. Cerebral volume is within normal limits for age. No significant white matter disease is seen. Major intracranial vascular flow voids are preserved. ORBITS: No acute abnormality. SINUSES AND MASTOIDS: Tiny mucous retention cyst in the left maxillary sinus. Small volume bilateral mastoid fluid. Likely trapped fluid in the left petrous apex. BONES AND SOFT TISSUES: Normal marrow signal. No acute soft tissue abnormality. IMPRESSION: 1. Unremarkable appearance of the brain for age. Electronically signed by: Dasie Hamburg MD 03/08/2024 12:51 PM EST RP Workstation: [id number]   ECHOCARDIOGRAM COMPLETE Result Date: 03/06/2024    ECHOCARDIOGRAM REPORT   Patient Name:   Robert Maldonado Date of Exam: 03/06/2024 Medical Rec #:  [id number]    Height:       76.0 in Accession #:    [id number]   Weight:       269.3 lb Date of Birth:  Dec 28, 1965   BSA:          2.514 m Patient Age:    58 years     BP:  112/76 mmHg Patient Gender: M            HR:           76 bpm. Exam Location:  [facility] Procedure: 2D Echo, Cardiac Doppler and Color Doppler (Both  Spectral and Color            Flow Doppler were utilized during procedure). Indications:     Syncope R55  History:         Patient has no prior history of Echocardiogram examinations.                  Risk Factors:Hypertension.  Sonographer:     Christopher Furnace Referring Phys:  [id number] LORANE POLAND Diagnosing Phys: Deatrice Cage MD IMPRESSIONS  1. Left ventricular ejection fraction, by estimation, is 55 to 60%. The left ventricle has normal function. The left ventricle has no regional wall motion abnormalities. There is mild left ventricular hypertrophy. Left ventricular diastolic parameters are consistent with Grade I diastolic dysfunction (impaired relaxation).  2. Right ventricular systolic function is normal. The right ventricular size is normal.  3. The mitral valve is normal in structure. No evidence of mitral valve regurgitation. No evidence of mitral stenosis.  4. The aortic valve is normal in structure. Aortic valve regurgitation is not visualized. No aortic stenosis is present. FINDINGS  Left Ventricle: Left ventricular ejection fraction, by estimation, is 55 to 60%. The left ventricle has normal function. The left ventricle has no regional wall motion abnormalities. The left ventricular internal cavity size was normal in size. There is  mild left ventricular hypertrophy. Left ventricular diastolic parameters are consistent with Grade I diastolic dysfunction (impaired relaxation). Right Ventricle: The right ventricular size is normal. No increase in right ventricular wall thickness. Right ventricular systolic function is normal. Left Atrium: Left atrial size was normal in size. Right Atrium: Right atrial size was normal in size. Pericardium: There is no evidence of pericardial effusion. Mitral Valve: The mitral valve is normal in structure. No evidence of mitral valve regurgitation. No evidence of mitral valve stenosis. MV peak gradient, 4.4 mmHg. The mean mitral valve gradient is 2.0 mmHg. Tricuspid Valve:  The tricuspid valve is normal in structure. Tricuspid valve regurgitation is not demonstrated. No evidence of tricuspid stenosis. Aortic Valve: The aortic valve is normal in structure. Aortic valve regurgitation is not visualized. No aortic stenosis is present. Aortic valve mean gradient measures 3.0 mmHg. Aortic valve peak gradient measures 5.5 mmHg. Aortic valve area, by VTI measures 3.03 cm. Pulmonic Valve: The pulmonic valve was normal in structure. Pulmonic valve regurgitation is not visualized. No evidence of pulmonic stenosis. Aorta: The aortic root is normal in size and structure. Venous: The inferior vena cava was not well visualized. IAS/Shunts: No atrial level shunt detected by color flow Doppler.  LEFT VENTRICLE PLAX 2D LVIDd:         4.30 cm   Diastology LVIDs:         3.00 cm   LV e' medial:    6.85 cm/s LV PW:         1.10 cm   LV E/e' medial:  10.1 LV IVS:        1.30 cm   LV e' lateral:   9.79 cm/s LVOT diam:     2.00 cm   LV E/e' lateral: 7.1 LV SV:         50 LV SV Index:   20 LVOT Area:     3.14 cm LV IVRT:  98 msec  RIGHT VENTRICLE RV Basal diam:  2.90 cm  PULMONARY VEINS RV Mid diam:    2.70 cm  Diastolic Velocity: 43.10 cm/s                          S/D Velocity:       0.50                          Systolic Velocity:  22.30 cm/s LEFT ATRIUM             Index        RIGHT ATRIUM           Index LA diam:        2.20 cm 0.88 cm/m   RA Area:     11.20 cm LA Vol (A2C):   28.2 ml 11.22 ml/m  RA Volume:   24.80 ml  9.87 ml/m LA Vol (A4C):   27.9 ml 11.10 ml/m LA Biplane Vol: 28.9 ml 11.50 ml/m  AORTIC VALVE AV Area (Vmax):    2.90 cm AV Area (Vmean):   2.68 cm AV Area (VTI):     3.03 cm AV Vmax:           117.00 cm/s AV Vmean:          80.200 cm/s AV VTI:            0.164 m AV Peak Grad:      5.5 mmHg AV Mean Grad:      3.0 mmHg LVOT Vmax:         108.00 cm/s LVOT Vmean:        68.400 cm/s LVOT VTI:          0.158 m LVOT/AV VTI ratio: 0.96  AORTA Ao Root diam: 3.50 cm MITRAL VALVE MV  Area (PHT): 2.94 cm    SHUNTS MV Area VTI:   1.95 cm    Systemic VTI:  0.16 m MV Peak grad:  4.4 mmHg    Systemic Diam: 2.00 cm MV Mean grad:  2.0 mmHg MV Vmax:       1.05 m/s MV Vmean:      65.9 cm/s MV Decel Time: 258 msec MV E velocity: 69.20 cm/s MV A velocity: 91.20 cm/s MV E/A ratio:  0.76 Deatrice Cage MD Electronically signed by Deatrice Cage MD Signature Date/Time: 03/06/2024/1:41:11 PM    Final    DG Lumbar Spine 2-3 Views Result Date: 03/02/2024 CLINICAL DATA:  Elective surgery. EXAM: LUMBAR SPINE - 2-3 VIEW COMPARISON:  None Available. FINDINGS: Three fluoroscopic spot views of the lumbar spine submitted from the operating room. Surgical instruments localize posteriorly from L3 through S1. Fluoroscopy time 6 seconds. Dose 3.87 mGy. IMPRESSION: Intraoperative fluoroscopy during lumbar surgery. Electronically Signed   By: Andrea Gasman M.D.   On: 03/02/2024 16:36   DG C-Arm 1-60 Min-No Report Result Date: 03/02/2024 Fluoroscopy was utilized by the requesting physician.  No radiographic interpretation.   DG C-Arm 1-60 Min-No Report Result Date: 03/02/2024 Fluoroscopy was utilized by the requesting physician.  No radiographic interpretation.    Microbiology: Results for orders placed or performed during the hospital encounter of 02/17/24  Surgical pcr screen     Status: None   Collection Time: 02/17/24  9:31 AM   Specimen: Nasal Mucosa; Nasal Swab  Result Value Ref Range Status   MRSA, PCR NEGATIVE NEGATIVE Final   Staphylococcus aureus NEGATIVE NEGATIVE Final  Comment: (NOTE) The Xpert SA Assay (FDA approved for NASAL specimens in patients 48 years of age and older), is one component of a comprehensive surveillance program. It is not intended to diagnose infection nor to guide or monitor treatment. Performed at [facility], [address] [place], [place] [address]     Labs:  CBC: Recent Labs  Lab 03/05/24 1134  WBC 7.9  HGB 13.5  HCT 39.6  MCV  90.0  PLT 182   Basic Metabolic Panel: Recent Labs  Lab 03/05/24 1134  NA 136  K 4.1  CL 100  CO2 25  GLUCOSE 93  BUN 15  CREATININE 1.31*  CALCIUM  9.3   CBG: Recent Labs  Lab 03/09/24 1728 03/09/24 1937 03/09/24 2119 03/10/24 0756 03/10/24 1200  GLUCAP 104* 121* 141* 157* 115*    Time spent: 35 minutes  Author: Penne LELON Sharps, MD  [facility]

## 2024-03-10 NOTE — Progress Notes (Signed)
 Attending Progress Note  History: Jahzeel C Rubinstein is here for lumbar decompression, they are currently 8 Days Post-Op.   POD8: Doing much better again today, he feels motivated with PT, and has been recognizing his gains.  POD7: Impoved pain control overnight, improved PT performance, brain MRI normal  POD6: Pt complaining uncontrolled pain overnight.  POD5: Pt resting comfortably in bed this morning. Reporting better pain control overnight  POD4: Pt experienced significant back pain this morning and continues to complain of dizziness when ambulating  POD3: Had difficulty with orthostasis and dizziness yesterday, was unable to walk to bathroom safely. POD2: Got dizzy yesterday with PTOT.  Feeling better today. POD1: Doing well.  Midline back pain but feels like his legs feel better already.  Physical Exam: Vitals:   03/09/24 1930 03/10/24 0341  BP: 132/76 117/76  Pulse: 72 84  Resp: 17 17  Temp: 97.9 F (36.6 C) 97.9 F (36.6 C)  SpO2: 99% 99%    AA Ox3 CNI  Resting in bed and appears comfortable . Incision with clean dressing in place  Data: MR BRAIN WO CONTRAST Result Date: 03/08/2024 EXAM: MRI BRAIN WITHOUT CONTRAST 03/08/2024 12:19:00 PM TECHNIQUE: Multiplanar multisequence MRI of the head/brain was performed without the administration of intravenous contrast. COMPARISON: None available. CLINICAL HISTORY: Syncope/presyncope, cerebrovascular cause suspected, dizziness. FINDINGS: The examination is mildly motion degraded. BRAIN AND VENTRICLES: There is no evidence of an acute infarct, intracranial hemorrhage, mass, midline shift, hydrocephalus, or extra-axial fluid collection. Cerebral volume is within normal limits for age. No significant white matter disease is seen. Major intracranial vascular flow voids are preserved. ORBITS: No acute abnormality. SINUSES AND MASTOIDS: Tiny mucous retention cyst in the left maxillary sinus. Small volume bilateral mastoid fluid. Likely trapped  fluid in the left petrous apex. BONES AND SOFT TISSUES: Normal marrow signal. No acute soft tissue abnormality. IMPRESSION: 1. Unremarkable appearance of the brain for age. Electronically signed by: Dasie Hamburg MD 03/08/2024 12:51 PM EST RP Workstation: [id number]    Assessment/Plan:  Cristan C Donigan has improving dizziness after surgery, brain Mri normal  - Drains: Removed - mobilize - pain control; comfortable today, better sleep overngiht.  - DVT prophylaxis - IM consulted and following. We appreciate your assistance.  - PTOT, Considering home health PT  Penne LELON Sharps MD Department of Neurosurgery

## 2024-03-10 NOTE — TOC Progression Note (Addendum)
 Transition of Care [facility]) - Progression Note    Patient Details  Name: Robert Maldonado MRN: [id number] Date of Birth: 1965-08-26  [facility] CM/SW Contact  Shasta DELENA Daring, RN Phone Number: 03/10/2024, 11:58 AM  Clinical Narrative:    RNCM presented SNF offers to patient. He expressed a desire to go home and use home health services. OT note recommended HH. Notifed by MD they will discharge with [facility] services. Patient had previously selected [facility] services and RNCM confirmed he wishes to continue with that selection. Notes sent via Hub.   1:44 PM Confirmed with Delonda at [facility] that they will deliver a rolling walker to patient room. Bedside RN notified.    Expected Discharge Plan: Home w Home Health Services Barriers to Discharge: Continued Medical Work up               Expected Discharge Plan and Services   Discharge Planning Services: CM Consult Post Acute Care Choice: Home Health Living arrangements for the past 2 months: Single Family Home Expected Discharge Date: 03/05/24               DME Arranged: N/A         HH Arranged: OT, PT HH Agency: [facility] Date HH Agency Contacted: 03/03/24 Time HH Agency Contacted: 1346 Representative spoke with at [facility] Agency: Shaun   Social Drivers of Health (SDOH) Interventions SDOH Screenings   Food Insecurity: No Food Insecurity (03/06/2024)  Housing: Low Risk  (03/06/2024)  Transportation Needs: No Transportation Needs (03/06/2024)  Utilities: Not At Risk (03/06/2024)  Financial Resource Strain: Medium Risk (10/07/2023)   Received from [facility]  Tobacco Use: Low Risk  (03/02/2024)    Readmission Risk Interventions     No data to display

## 2024-03-10 NOTE — Plan of Care (Signed)
  Problem: Skin Integrity: Goal: Risk for impaired skin integrity will decrease Outcome: Progressing   Problem: Safety: Goal: Ability to remain free from injury will improve Outcome: Progressing   Problem: Pain Managment: Goal: General experience of comfort will improve and/or be controlled Outcome: Progressing   Problem: Elimination: Goal: Will not experience complications related to bowel motility Outcome: Progressing

## 2024-03-10 NOTE — Progress Notes (Signed)
 Physical Therapy Treatment Patient Details Name: Robert Maldonado MRN: [id number] DOB: 09-Sep-1965 Today's Date: 03/10/2024   History of Present Illness Robert Maldonado is a 58yoM who presents to [facility] 03/02/24 for elective Left L3-S1 laminectomy in the setting of lumbar spinal stenosis, myelopathy, LLE pain, weakness, myoclonus. PMH: ACDF.    PT Comments  Pt pleasant and motivated and ultimately showed great effort with a more prolonged ambulation that he has been able to do (60 ft) with heavy walker use and a lot of sweating and effort.  Pt continues to have a lot of issues with ataxia, R foot drop and coordination issues but did manage to get up and down steps showing ability to get in/out of the home.  Pt will benefit from continued PT to address functional limitations.   If plan is discharge home, recommend the following: A little help with bathing/dressing/bathroom;Assistance with cooking/housework;Direct supervision/assist for medications management;Assist for transportation;Help with stairs or ramp for entrance;Two people to help with walking and/or transfers   Can travel by private vehicle        Equipment Recommendations  Rolling walker (2 wheels)    Recommendations for Other Services Rehab consult     Precautions / Restrictions Precautions Precautions: Fall;Back Recall of Precautions/Restrictions: Intact Precaution/Restrictions Comments: no brace needed Restrictions Weight Bearing Restrictions Per Provider Order: No     Mobility  Bed Mobility Overal bed mobility: Needs Assistance Bed Mobility: Supine to Sit Rolling: Used rails Sidelying to sit: Supervision, HOB elevated, Used rails       General bed mobility comments: able to get EOB without physical A    Transfers Overall transfer level: Needs assistance Equipment used: Rolling walker (2 wheels) Transfers: Sit to/from Stand Sit to Stand: Contact guard assist, Supervision           General transfer comment: heavy  UE use, but able to effectively get up from multiple surfaces t/o phyiscal assist - cuing for appropriate UE placement    Ambulation/Gait Ambulation/Gait assistance: Min assist, Contact guard assist Gait Distance (Feet): 60 Feet Assistive device: Rolling walker (2 wheels)         General Gait Details: 60 ft with great effort but vitals remaining appropriate.  He continues to ahve L ankle drop and b/l LE ataxic foot placement but was better able to stay overtop of the walker and appeared to fatigued less quickly while maintaining improved posture.   Stairs Stairs: Yes Stairs assistance: Contact guard assist Stair Management: Two rails, One rail Left Number of Stairs: 4 General stair comments: Pt was able to negotiate up/down steps using multiple strategies (b/l rails and then single rail).  He did need considerable cuing to insure L knee extension during weight acceptance; expectedly highly reliant on UEs but able to do so w/o physical assist.   Wheelchair Mobility     Tilt Bed    Modified Rankin (Stroke Patients Only)       Balance Overall balance assessment: Needs assistance Sitting-balance support: Feet supported Sitting balance-Leahy Scale: Good     Standing balance support: Bilateral upper extremity supported, During functional activity, Reliant on assistive device for balance Standing balance-Leahy Scale: Fair Standing balance comment: remains at inc risk for falls due to LE weakness and pain.  minimal c/o dizziness end of session                            Communication Communication Communication: No apparent difficulties  Cognition Arousal: Alert  Behavior During Therapy: WFL for tasks assessed/performed                             Following commands: Intact      Cueing Cueing Techniques: Verbal cues, Tactile cues  Exercises      General Comments General comments (skin integrity, edema, etc.): Pt continues to have discoordination  with LEs and ataxic steppage, but ultimately showed ability to manage in-home distances      Pertinent Vitals/Pain Pain Assessment Pain Assessment: Faces Faces Pain Scale: Hurts a little bit Pain Location: low back much less c/o pain than yesterday it's there, like it always is    Home Living                          Prior Function            PT Goals (current goals can now be found in the care plan section) Progress towards PT goals: Progressing toward goals    Frequency    7X/week      PT Plan      Co-evaluation PT/OT/SLP Co-Evaluation/Treatment: Yes Reason for Co-Treatment: For patient/therapist safety;To address functional/ADL transfers PT goals addressed during session: Mobility/safety with mobility OT goals addressed during session: Proper use of Adaptive equipment and DME      AM-PAC PT 6 Clicks Mobility   Outcome Measure  Help needed turning from your back to your side while in a flat bed without using bedrails?: A Little Help needed moving from lying on your back to sitting on the side of a flat bed without using bedrails?: A Little Help needed moving to and from a bed to a chair (including a wheelchair)?: A Little Help needed standing up from a chair using your arms (e.g., wheelchair or bedside chair)?: A Little Help needed to walk in hospital room?: A Little Help needed climbing 3-5 steps with a railing? : A Lot 6 Click Score: 17    End of Session Equipment Utilized During Treatment: Gait belt Activity Tolerance: Patient limited by fatigue;Patient limited by pain Patient left: with call bell/phone within reach;in chair Nurse Communication: Mobility status PT Visit Diagnosis: Difficulty in walking, not elsewhere classified (R26.2);Other abnormalities of gait and mobility (R26.89);Other symptoms and signs involving the nervous system (R29.898);Dizziness and giddiness (R42);Unsteadiness on feet (R26.81);Muscle weakness (generalized)  (M62.81)     Time: 8981-8951 PT Time Calculation (min) (ACUTE ONLY): 30 min  Charges:    $Gait Training: 8-22 mins PT General Charges $$ ACUTE PT VISIT: 1 Visit                     Jalisa Sacco R Euan Wandler. DPT 03/10/2024, 1:04 PM

## 2024-03-12 NOTE — H&P (Signed)
 Documented on 11/20 as a progress note

## 2024-03-13 ENCOUNTER — Telehealth: Payer: Self-pay | Admitting: Neurosurgery

## 2024-03-13 NOTE — Telephone Encounter (Signed)
 Called Robert Maldonado to discuss, she was not able to speak at the moment and said she would call back later.

## 2024-03-13 NOTE — Telephone Encounter (Signed)
 Patient's significant other, Nat called to see if patient needs to keep his appointment for tomorrow since he was just discharged on Friday. Please advise.

## 2024-03-13 NOTE — Telephone Encounter (Signed)
 Pt's wife states he has a bandage and has staples. I explained that if he has staples, we will need to keep the appointment so we can remove the staples. She requested to move the appt from Tuesday to Wednesday. [name] had an opening on Wednesday.

## 2024-03-14 ENCOUNTER — Encounter: Admitting: Physician Assistant

## 2024-03-14 NOTE — Progress Notes (Unsigned)
   REFERRING PHYSICIAN:  Cleotilde Oneil FALCON, Md [address] [facility] [place],  [place] [address]  DOS: 03/02/24  L3-S1 laminectomy  HISTORY OF PRESENT ILLNESS: Robert Maldonado is approximately 2 weeks status post above surgery. Was given dilaudid , robaxin , valium  on discharge from the hospital.   Not taking ultram ?***   PHYSICAL EXAMINATION:  General: Patient is well developed, well nourished, calm, collected, and in no apparent distress.   NEUROLOGICAL:  General: In no acute distress.   Awake, alert, oriented to person, place, and time.  Pupils equal round and reactive to light.  Facial tone is symmetric.     Strength:            Side Iliopsoas Quads Hamstring PF DF EHL  R 5 5 5 5 5 5   L 3*** 5 5 4-*** 2*** 5   Incision c/d/i   ROS (Neurologic):  Negative except as noted above  IMAGING: Nothing new to review.   ASSESSMENT/PLAN:  Robert Maldonado is doing well s/p above surgery. Treatment options reviewed with patient and following plan made:   - I have advised the patient to lift up to 10 pounds until 6 weeks after surgery (follow up with Dr. Claudene).  - Reviewed wound care.  - No bending, twisting, or lifting.  - Continue on current medications including ***.  - Follow up as scheduled in 4 weeks and prn.   Advised to contact the office if any questions or concerns arise.  Robert Boys PA-C Department of neurosurgery

## 2024-03-15 ENCOUNTER — Encounter: Payer: Self-pay | Admitting: Orthopedic Surgery

## 2024-03-15 ENCOUNTER — Ambulatory Visit: Admitting: Orthopedic Surgery

## 2024-03-15 ENCOUNTER — Telehealth: Payer: Self-pay | Admitting: Orthopedic Surgery

## 2024-03-15 ENCOUNTER — Other Ambulatory Visit: Payer: Self-pay

## 2024-03-15 VITALS — BP 130/78 | Temp 98.2°F | Ht 76.0 in | Wt 273.0 lb

## 2024-03-15 DIAGNOSIS — Z9889 Other specified postprocedural states: Secondary | ICD-10-CM

## 2024-03-15 DIAGNOSIS — M4726 Other spondylosis with radiculopathy, lumbar region: Secondary | ICD-10-CM

## 2024-03-15 DIAGNOSIS — R29898 Other symptoms and signs involving the musculoskeletal system: Secondary | ICD-10-CM

## 2024-03-15 MED ORDER — HYDROMORPHONE HCL 2 MG PO TABS: 2.0000 mg | ORAL_TABLET | Freq: Four times a day (QID) | ORAL | 0 refills | Status: AC | PRN

## 2024-03-15 NOTE — Addendum Note (Signed)
 Addended byBETHA HILMA HASTINGS on: 03/15/2024 03:55 PM   Modules accepted: Orders

## 2024-03-15 NOTE — Telephone Encounter (Signed)
 Nichole-significant other, was not with the patient, but stated she has been taking care of his medications. She stated patient has been taking Dilaudid  2 mg 1 tablet about every 6 hours instead of 4 hours. I advised that directions were to take 1/2 tablet, Nichole stated at first the directions were 1 tablet and that there was no way to cut the tablet in half as it would crumble. Nichole stated patient has enough to take for tomorrow-03/16/24-morning and no more after that. She was going to double check the directions on the bottle when she got home and I advised that I would call [facility] pharmacy also and ask how they dispensed the medication. I spoke with [facility] pharmacist and confirmed that patient was given Dilaudid  2 mg whole tablets with directions to cut them in half. Pharmacist confirmed that the tablets can be cut in half and would not crumble-or at least should not.

## 2024-03-15 NOTE — Telephone Encounter (Signed)
 I just saw him for his postop visit and we discussed refill of dilaudid .   Looks like he was supposed to do dilaudid  1/2 po q 4 hours. He should have enough to last until Friday. Please call and find out.   I can send refill to be ready for Friday.   Let me know.

## 2024-03-15 NOTE — Telephone Encounter (Signed)
 Nat advised and verbalized understanding.

## 2024-03-15 NOTE — Telephone Encounter (Addendum)
 Discussed medications with Dr. Claudene after patient left. He should be taking his home tramadol  from Dr. Cleotilde as directed. If he needs a refill of this, he should get from Dr. Cleotilde.   He should take the dilaudid  only as needed for pain that is not controlled by his tramadol . Directions will be changed to dilaudid  2mg  1 po q 6 hours to pain refractory to tramadol .   Please call and let him know. Refill of dilaudid  sent to pharmacy with new directions.

## 2024-04-14 ENCOUNTER — Telehealth: Payer: Self-pay | Admitting: Neurosurgery

## 2024-04-14 NOTE — Telephone Encounter (Signed)
 Spoke to [employer], due to insurance change needed an ok for continuation of care.

## 2024-04-14 NOTE — Telephone Encounter (Signed)
 Per [facility] / [phone] / Option 2 The pt insurance changed in 2026, and needing a provider to ok to continue home health.

## 2024-04-19 ENCOUNTER — Ambulatory Visit: Admitting: Neurosurgery

## 2024-04-19 DIAGNOSIS — Z09 Encounter for follow-up examination after completed treatment for conditions other than malignant neoplasm: Secondary | ICD-10-CM

## 2024-04-19 DIAGNOSIS — Z9889 Other specified postprocedural states: Secondary | ICD-10-CM | POA: Insufficient documentation

## 2024-04-19 DIAGNOSIS — M4726 Other spondylosis with radiculopathy, lumbar region: Secondary | ICD-10-CM

## 2024-04-19 NOTE — Progress Notes (Signed)
 I had a follow-up phone call today with Robert Maldonado.  He was at home I was in the office.  Gave consent to go forward with a phone visit.  Overall he is doing better.  Working hard with physical therapy and having functional improvement.  Incision is healing well.  At this point would continue to recommend that he continues to work with physical therapy.  Look forward to seeing him in person.  He had a conflict today and was unable to get here in person so we did this visit via telephone.  Spent a total of 10 minutes on the phone call today.  He did not have any further needs.  Robert Maldonado Sharps, MD

## 2024-05-10 ENCOUNTER — Telehealth: Payer: Self-pay

## 2024-05-10 NOTE — Telephone Encounter (Signed)
 Spoke to [place] at [facility] and gave verbal orders for physical therapy.

## 2024-05-22 ENCOUNTER — Encounter: Admitting: Physician Assistant

## 2024-05-30 ENCOUNTER — Encounter
